# Patient Record
Sex: Male | Born: 1975
Health system: Southern US, Community
[De-identification: ages and names within clinical notes are randomized; demographics above are authoritative.]

## PROBLEM LIST (undated history)

## (undated) DIAGNOSIS — J302 Other seasonal allergic rhinitis: Secondary | ICD-10-CM

## (undated) DIAGNOSIS — E119 Type 2 diabetes mellitus without complications: Secondary | ICD-10-CM

## (undated) DIAGNOSIS — G8929 Other chronic pain: Secondary | ICD-10-CM

## (undated) DIAGNOSIS — J45909 Unspecified asthma, uncomplicated: Secondary | ICD-10-CM

## (undated) DIAGNOSIS — M549 Dorsalgia, unspecified: Secondary | ICD-10-CM

## (undated) HISTORY — PX: KNEE SURGERY: SHX244

## (undated) HISTORY — DX: Type 2 diabetes mellitus without complications: E11.9

## (undated) HISTORY — PX: KNEE ARTHROSCOPY: SUR90

---

## 1998-11-25 ENCOUNTER — Emergency Department (HOSPITAL_COMMUNITY): Admission: EM | Admit: 1998-11-25 | Discharge: 1998-11-25 | Payer: Self-pay | Admitting: Emergency Medicine

## 1999-08-05 ENCOUNTER — Emergency Department (HOSPITAL_COMMUNITY): Admission: EM | Admit: 1999-08-05 | Discharge: 1999-08-05 | Payer: Self-pay | Admitting: Emergency Medicine

## 1999-11-27 ENCOUNTER — Emergency Department (HOSPITAL_COMMUNITY): Admission: EM | Admit: 1999-11-27 | Discharge: 1999-11-27 | Payer: Self-pay | Admitting: Emergency Medicine

## 2000-05-20 ENCOUNTER — Emergency Department (HOSPITAL_COMMUNITY): Admission: EM | Admit: 2000-05-20 | Discharge: 2000-05-20 | Payer: Self-pay | Admitting: Emergency Medicine

## 2000-10-30 ENCOUNTER — Emergency Department (HOSPITAL_COMMUNITY): Admission: EM | Admit: 2000-10-30 | Discharge: 2000-10-30 | Payer: Self-pay | Admitting: Emergency Medicine

## 2001-01-02 ENCOUNTER — Encounter: Payer: Self-pay | Admitting: Emergency Medicine

## 2001-01-02 ENCOUNTER — Emergency Department (HOSPITAL_COMMUNITY): Admission: EM | Admit: 2001-01-02 | Discharge: 2001-01-02 | Payer: Self-pay | Admitting: Emergency Medicine

## 2003-01-05 ENCOUNTER — Emergency Department (HOSPITAL_COMMUNITY): Admission: EM | Admit: 2003-01-05 | Discharge: 2003-01-05 | Payer: Self-pay | Admitting: Emergency Medicine

## 2003-02-01 ENCOUNTER — Emergency Department (HOSPITAL_COMMUNITY): Admission: EM | Admit: 2003-02-01 | Discharge: 2003-02-01 | Payer: Self-pay | Admitting: Emergency Medicine

## 2003-02-13 ENCOUNTER — Emergency Department (HOSPITAL_COMMUNITY): Admission: EM | Admit: 2003-02-13 | Discharge: 2003-02-13 | Payer: Self-pay | Admitting: Emergency Medicine

## 2003-11-18 ENCOUNTER — Emergency Department (HOSPITAL_COMMUNITY): Admission: EM | Admit: 2003-11-18 | Discharge: 2003-11-18 | Payer: Self-pay | Admitting: Emergency Medicine

## 2004-04-12 ENCOUNTER — Emergency Department (HOSPITAL_COMMUNITY): Admission: EM | Admit: 2004-04-12 | Discharge: 2004-04-12 | Payer: Self-pay | Admitting: *Deleted

## 2004-08-05 ENCOUNTER — Emergency Department (HOSPITAL_COMMUNITY): Admission: EM | Admit: 2004-08-05 | Discharge: 2004-08-05 | Payer: Self-pay | Admitting: Family Medicine

## 2004-08-18 ENCOUNTER — Emergency Department (HOSPITAL_COMMUNITY): Admission: EM | Admit: 2004-08-18 | Discharge: 2004-08-18 | Payer: Self-pay | Admitting: Family Medicine

## 2004-12-14 ENCOUNTER — Emergency Department (HOSPITAL_COMMUNITY): Admission: EM | Admit: 2004-12-14 | Discharge: 2004-12-14 | Payer: Self-pay | Admitting: Family Medicine

## 2004-12-16 ENCOUNTER — Emergency Department (HOSPITAL_COMMUNITY): Admission: EM | Admit: 2004-12-16 | Discharge: 2004-12-16 | Payer: Self-pay | Admitting: Family Medicine

## 2005-10-30 ENCOUNTER — Emergency Department (HOSPITAL_COMMUNITY): Admission: EM | Admit: 2005-10-30 | Discharge: 2005-10-30 | Payer: Self-pay | Admitting: Emergency Medicine

## 2006-02-02 ENCOUNTER — Emergency Department (HOSPITAL_COMMUNITY): Admission: EM | Admit: 2006-02-02 | Discharge: 2006-02-02 | Payer: Self-pay | Admitting: Family Medicine

## 2008-10-07 ENCOUNTER — Emergency Department (HOSPITAL_COMMUNITY): Admission: EM | Admit: 2008-10-07 | Discharge: 2008-10-07 | Payer: Self-pay | Admitting: Emergency Medicine

## 2008-10-10 ENCOUNTER — Emergency Department (HOSPITAL_COMMUNITY): Admission: EM | Admit: 2008-10-10 | Discharge: 2008-10-10 | Payer: Self-pay | Admitting: Family Medicine

## 2010-05-13 ENCOUNTER — Encounter: Admission: RE | Admit: 2010-05-13 | Discharge: 2010-05-13 | Payer: Self-pay | Admitting: Internal Medicine

## 2013-05-06 ENCOUNTER — Emergency Department (INDEPENDENT_AMBULATORY_CARE_PROVIDER_SITE_OTHER)
Admission: EM | Admit: 2013-05-06 | Discharge: 2013-05-06 | Disposition: A | Discharge status: Home / Self Care | Payer: Self-pay | Source: Home / Self Care | Attending: Family Medicine | Admitting: Family Medicine

## 2013-05-06 ENCOUNTER — Encounter (HOSPITAL_COMMUNITY): Payer: Self-pay | Admitting: Emergency Medicine

## 2013-05-06 DIAGNOSIS — L03011 Cellulitis of right finger: Secondary | ICD-10-CM

## 2013-05-06 DIAGNOSIS — L03019 Cellulitis of unspecified finger: Secondary | ICD-10-CM

## 2013-05-06 MED ORDER — SULFAMETHOXAZOLE-TRIMETHOPRIM 800-160 MG PO TABS: 1.0000 | ORAL_TABLET | Freq: Two times a day (BID) | ORAL | Status: DC

## 2013-05-06 NOTE — ED Notes (Signed)
C/o pain and swelling in right pinkie finger  X 1 1/2 wks. Tried soaking in alcohol with mild relief. Denies any other symptoms.

## 2013-05-06 NOTE — ED Provider Notes (Signed)
Justin Drake is a 37 y.o. male who presents to Urgent Care today for right fifth finger paronychia present for about one and a half weeks. Patient has tried alcohol soaks which haven't worked well. He denies any radiating pain weakness numbness fevers or chills. 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 He denies any nausea vomiting or diarrhea.   History reviewed. No pertinent past medical history. History  Substance Use Topics  . Smoking status: Never Smoker   . Smokeless tobacco: Not on file  . Alcohol Use: No   ROS as above Medications reviewed. No current facility-administered medications for this encounter.   Current Outpatient Prescriptions  Medication Sig Dispense Refill  . sulfamethoxazole-trimethoprim (SEPTRA DS) 800-160 MG per tablet Take 1 tablet by mouth 2 (two) times daily.  14 tablet  0    Exam:  BP 121/86  Pulse 76  Temp(Src) 97.9 F (36.6 C) (Oral)  Resp 16  SpO2 99% Gen: Well NAD RIGHT FIFTH FINGER: Swollen fluctuant and tender just proximal to the nail fold. Tablet refill sensation motion is intact distally  Right fifth paronychia drainage: Consent obtained Area of fluctuance cleaned with alcohol cold spray applied and a small incision was made with 11 blade scalpel. Pus and blood was expressed. The pus was cultured. Patient tolerated the procedure well  Assessment and Plan: 37 y.o. male with paronychia status post incision and drainage and culture. Plan to treat empirically with Septra antibiotics.  Followup as needed Discussed warning signs or symptoms. Please see discharge instructions. Patient expresses understanding.      Rodolph Bong, MD 05/06/13 [phone]

## 2013-05-08 LAB — CULTURE, ROUTINE-ABSCESS

## 2013-05-09 ENCOUNTER — Telehealth (HOSPITAL_COMMUNITY): Payer: Self-pay | Admitting: *Deleted

## 2013-05-09 MED ORDER — CEPHALEXIN 500 MG PO CAPS: 500.0000 mg | ORAL_CAPSULE | Freq: Four times a day (QID) | ORAL | Status: DC

## 2013-05-09 NOTE — ED Notes (Signed)
RN to inform patient.   Rodolph Bong, MD 05/09/13 [phone]

## 2013-05-09 NOTE — ED Notes (Signed)
Discussed with Dr. Denyse Amass and he said to call for clinical improvement (like pain).  I called pt. Pt. verified x 2 and given results.  I asked how he was doing.  He said it is hurting really bad.  He has tried soaking it in alcohol and taking 3 Advil but still feels pressure and throbbing. No drainage since the day it was drained.  He uses [facility] on [address]. Dr. Denyse Amass notified and he e-prescribed Keflex 500 QID x 10 days.  Pt. notified and will pick up the Rx. Today.  Pt. instructed to come back if any worsening of symptoms. Vassie Moselle 05/09/2013

## 2013-05-09 NOTE — Telephone Encounter (Signed)
Message copied by Rodolph Bong on Thu May 09, 2013  4:37 PM ------      Message from: Vassie Moselle      Created: Wed May 08, 2013 11:10 PM      Regarding: lab       Abscess culture: few Viridans Streptococcus-no sensitivity.  Treated with Septra DS.  Is treatment adequate?      Vassie Moselle      05/08/2013       ------

## 2013-11-10 ENCOUNTER — Encounter (HOSPITAL_COMMUNITY): Payer: Self-pay | Admitting: Emergency Medicine

## 2013-11-10 ENCOUNTER — Emergency Department (HOSPITAL_COMMUNITY)
Admission: EM | Admit: 2013-11-10 | Discharge: 2013-11-10 | Disposition: A | Discharge status: Home / Self Care | Payer: Self-pay | Attending: Emergency Medicine | Admitting: Emergency Medicine

## 2013-11-10 DIAGNOSIS — B370 Candidal stomatitis: Secondary | ICD-10-CM | POA: Insufficient documentation

## 2013-11-10 DIAGNOSIS — K117 Disturbances of salivary secretion: Secondary | ICD-10-CM | POA: Insufficient documentation

## 2013-11-10 DIAGNOSIS — Z79899 Other long term (current) drug therapy: Secondary | ICD-10-CM | POA: Insufficient documentation

## 2013-11-10 DIAGNOSIS — R682 Dry mouth, unspecified: Secondary | ICD-10-CM

## 2013-11-10 MED ORDER — NYSTATIN 100000 UNIT/ML MT SUSP: 500000.0000 [IU] | Freq: Four times a day (QID) | OROMUCOSAL | Status: DC

## 2013-11-10 NOTE — ED Notes (Signed)
Pt states recently sick. Has sore throat, and states painful to swallow.

## 2013-11-10 NOTE — Discharge Instructions (Signed)
Sore or Dry Mouth Care A sore or dry mouth may happen for many different reasons. Sometimes, treatment for other health problems may have to stop until your sore or dry mouth gets better.  HOME CARE  Do not smoke or chew tobacco.  Use fake (artificial) saliva when your mouth feels dry.  Use a humidifier in your bedroom at night.  Eat small meals and snacks.  Eat food cold or at room temperature.  Suck on ice-chips or try frozen ice pops or juice bars, ice-cream, and watermelon. Do not have citrus flavors.  Suck on hard, sugarless, sour candy, or chew sugarless gum to help make more saliva.  Eat soft foods such as yogurt, bananas, canned fruit, mashed potatoes, oatmeal, rice, eggs, cottage cheese, macaroni and cheese, jello, and pudding.  Microwave vegetables and fruits to soften them.  Puree cooked food in a blender if needed.  Make dry food moist by using olive oil, gravy, or mild sauces. Dip foods in liquids.  Keep a glass of water or squirt bottle nearby. Take sips often throughout the day.  Limit caffeine.  Avoid:  Pop or fizzy drinks.  Alcohol.  Citrus juices.  Acidic food.  Salty or spicy food.  Foods or drinks that are very hot.  Hard or crunchy food. Mouth Care  Wash your hands well with soap and water before doing mouth care.  Use fake saliva as told by your doctor.  Use medicine on the sore places.  Brush your teeth at least 2 times a day. Brush after each meal if possible. Rinse your mouth with water after each meal and after drinking a sweet drink.  Brush slowly and gently in small circles. Do not brush side-to-side.  Use regular toothpastes, but stay away from ones that have sodium laurel sulfate in them.  Gargle with a baking soda mouthwash ( teaspoon baking soda mixed in with 4 cups of water).  Gargle with medicated mouthwash.  Use dental floss or dental tape to clean between your teeth every day.  Use a lanolin-based lip balm to keep  your lips from getting dry.  If you wear dentures or bridges:  You may need to leave them out until your doctor tells you to start wearing them again.  Take them out at night if you wear them daily. Soak them in warm water or denture solution. Take your dentures out as much as you can during the day. Take them out when you use mouthwash.  After each meal, brush your gums gently with a soft brush and rinse your mouth with water.  If your dentures rub on your gums and cause a sore spot, have your dentist check and fix your dentures right away. GET HELP RIGHT AWAY IF:   Your mouth gets more painful or dry.  You have questions. MAKE SURE YOU:  Understand these instructions.  Will watch your condition.  Will get help right away if you are not doing well or get worse. Document Released: 05/15/2009 Document Revised: 10/10/2011 Document Reviewed: 05/15/2009 [facility] Patient Information 2014 [place], [place]. Thrush, Adult  Ginette Pitman, also called oral candidiasis, is a fungal infection that develops in the mouth and throat and on the tongue. It causes white patches to form on the mouth and tongue. Ginette Pitman is most common in older adults, but it can occur at any age.  Many cases of thrush are mild, but this infection can also be more serious. Ginette Pitman can be a recurring problem for people who have chronic  illnesses or who take medicines that limit the body's ability to fight infection. Because these people have difficulty fighting infections, the fungus that causes thrush can spread throughout the body. This can cause life-threatening blood or organ infections. CAUSES  Ginette Pitman is usually caused by a yeast called Candida albicans. This fungus is normally present in small amounts in the mouth and on other mucous membranes. It usually causes no harm. However, when conditions are present that allow the fungus to grow uncontrolled, it invades surrounding tissues and becomes an infection. Less often, other  Candida species can also lead to thrush.  RISK FACTORS Ginette Pitman is more likely to develop in the following people:  People with an impaired ability to fight infection (weakened immune system).   Older adults.   People with HIV.   People with diabetes.   People with dry mouth (xerostomia).   Pregnant women.   People with poor dental care, especially those who have false teeth.   People who use antibiotic medicines.  SIGNS AND SYMPTOMS  Ginette Pitman can be a mild infection that causes no symptoms. If symptoms develop, they may include:   A burning feeling in the mouth and throat. This can occur at the start of a thrush infection.   White patches that adhere to the mouth and tongue. The tissue around the patches may be red, raw, and painful. If rubbed (during tooth brushing, for example), the patches and the tissue of the mouth may bleed easily.   A bad taste in the mouth or difficulty tasting foods.   Cottony feeling in the mouth.   Pain during eating and swallowing. DIAGNOSIS  Your health care provider can usually diagnose thrush by looking in your mouth and asking you questions about your health.  TREATMENT  Medicines that help prevent the growth of fungi (antifungals) are the standard treatment for thrush. These medicines are either applied directly to the affected area (topical) or swallowed (oral). The treatment will depend on the severity of the condition.  Mild Thrush Mild cases of thrush may clear up with the use of an antifungal mouth rinse or lozenges. Treatment usually lasts about 14 days.  Moderate to Severe Thrush  More severe thrush infections that have spread to the esophagus are treated with an oral antifungal medicine. A topical antifungal medicine may also be used.   For some severe infections, a treatment period longer than 14 days may be needed.   Oral antifungal medicines are almost never used during pregnancy because the fetus may be harmed.  However, if a pregnant woman has a rare, severe thrush infection that has spread to her blood, oral antifungal medicines may be used. In this case, the risk of harm to the mother and fetus from the severe thrush infection may be greater than the risk posed by the use of antifungal medicines.  Persistent or Recurrent Thrush For cases of thrush that do not go away or keep coming back, treatment may involve the following:   Treatment may be needed twice as long as the symptoms last.   Treatment will include both oral and topical antifungal medicines.   People with weakened immune systems can take an antifungal medicine on a continuous basis to prevent thrush infections.  It is important to treat conditions that make you more likely to get thrush, such as diabetes or HIV.  HOME CARE INSTRUCTIONS   Only take over-the-counter or prescription medicine as directed by your health care provider. Talk to your health care provider about  an over-the-counter medicine called gentian violet, which kills bacteria and fungi.   Eat plain, unflavored yogurt as directed by your health care provider. Check the label to make sure the yogurt contains live cultures. This yogurt can help healthy bacteria grow in the mouth that can stop the growth of the fungus that causes thrush.   Try these measures to help reduce the discomfort of thrush:   Drink cold liquids such as water or iced tea.   Try flavored ice treats or frozen juices.   Eat foods that are easy to swallow, such as gelatin, ice cream, or custard.   If the patches in your mouth are painful, try drinking from a straw.   Rinse your mouth several times a day with a warm saltwater rinse. You can make the saltwater mixture with 1 tsp (6 g) of salt in 8 fl oz (0.2 L) of warm water.   If you wear dentures, remove the dentures before going to bed, brush them vigorously, and soak them in a cleaning solution as directed by your health care provider.    Women who are breastfeeding should clean their nipples with an antifungal medicine as directed by their health care provider. Dry the nipples after breastfeeding. Applying lanolin-containing body lotion may help relieve nipple soreness.  SEEK MEDICAL CARE IF:  Your symptoms are getting worse or are not improving within 7 days of starting treatment.   You have symptoms of spreading infection, such as white patches on the skin outside of the mouth.   You are nursing and you have redness, burning, or pain in the nipples that is not relieved with treatment.  MAKE SURE YOU:  Understand these instructions.  Will watch your condition.  Will get help right away if you are not doing well or get worse. Document Released: 04/12/2004 Document Revised: 05/08/2013 Document Reviewed: 02/18/2013 [facility] Patient Information 2014 [place]

## 2013-11-10 NOTE — ED Provider Notes (Signed)
CSN: [id number]     Arrival date & time 11/10/13  2112 History  This chart was scribed for Justin Drake Justin Santellan, NP working with Glynn OctaveStephen Rancour, MD by Quintella ReichertMatthew Underwood, ED Scribe. This patient was seen in room TR09C/TR09C and the patient's care was started at 10:11 PM.   Chief Complaint  Patient presents with  . Sore Throat    Patient is a 38 y.o. male presenting with pharyngitis. The history is provided by the patient. No language interpreter was used.  Sore Throat This is a new problem. The current episode started more than 2 days ago. The problem occurs constantly. The problem has been gradually worsening. Exacerbated by: swallowing. Treatments tried: biotin. The treatment provided no relief.    HPI Comments: Justin Drake is a 38 y.o. male who presents to the Emergency Department complaining of "cotton mouth" that began 5 days ago.  Pt states he has seasonal allergies and has had recent upper respiratory symptoms due to pollen, including productive cough and congestion.  He used antihistamines but states his mouth has become extremely dry since then.  He states his mouth feels uncomfortable but denies pain or itching in his throat.  Discomfort is worsened by swallowing and he states he has not been eating solids due to this.  He has attempted to treat symptoms with biotin, without relief.  Pt denies recent antibiotic use.  He denies h/o DM.  He denies any other recent illness.  Pt has no PCP   History reviewed. No pertinent past medical history.   Past Surgical History  Procedure Laterality Date  . Knee surgery      History reviewed. No pertinent family history.   History  Substance Use Topics  . Smoking status: Never Smoker   . Smokeless tobacco: Never Used  . Alcohol Use: No     Review of Systems  Constitutional: Negative for fever.  HENT: Positive for congestion. Negative for sore throat.        "cotton mouth"  Respiratory: Positive for cough.   All other systems  reviewed and are negative.     Allergies  Review of patient's allergies indicates no known allergies.  Home Medications   Current Outpatient Rx  Name  Route  Sig  Dispense  Refill  . fexofenadine-pseudoephedrine (ALLEGRA-D 24) 180-240 MG per 24 hr tablet   Oral   Take 1 tablet by mouth daily.          BP 146/96  Pulse 93  Temp(Src) 98.5 F (36.9 C) (Oral)  Resp 20  Ht 5\' 9"  (1.753 m)  Wt 230 lb 12.8 oz (104.69 kg)  BMI 34.07 kg/m2  SpO2 98%  Physical Exam  Nursing note and vitals reviewed. Constitutional: He is oriented to person, place, and time. He appears well-developed and well-nourished. No distress.  HENT:  Head: Normocephalic and atraumatic.  White plaques on the soft palate and uvula  Eyes: EOM are normal.  Neck: Neck supple. No tracheal deviation present.  Cardiovascular: Normal rate.   Pulmonary/Chest: Effort normal. No respiratory distress.  Musculoskeletal: Normal range of motion.  Neurological: He is alert and oriented to person, place, and time.  Skin: Skin is warm and dry.  Psychiatric: He has a normal mood and affect. His behavior is normal.    ED Course  Procedures (including critical care time)  DIAGNOSTIC STUDIES: Oxygen Saturation is 98% on room air, normal by my interpretation.    COORDINATION OF CARE: 10:16 PM-Discussed treatment plan which includes Nystatin with pt  at bedside and pt agreed to plan.     Labs Review Labs Reviewed - No data to display  Imaging Review No results found.   EKG Interpretation None      MDM   Final diagnoses:  None    Oral thrush in an non-immunocompromised patient who reports recent dry mouth symptoms..      I personally performed the services described in this documentation, which was scribed in my presence. The recorded information has been reviewed and is accurate.    Justin Norman, NP 11/11/13 [phone]

## 2013-11-11 NOTE — ED Provider Notes (Signed)
Medical screening examination/treatment/procedure(s) were performed by non-physician practitioner and as supervising physician I was immediately available for consultation/collaboration.   EKG Interpretation None       Glynn OctaveStephen Haydon Dorris, MD 11/11/13 1100

## 2013-11-17 ENCOUNTER — Observation Stay (HOSPITAL_COMMUNITY)
Admission: EM | Admit: 2013-11-17 | Discharge: 2013-11-18 | Disposition: A | Discharge status: Home / Self Care | Payer: Self-pay | Attending: Internal Medicine | Admitting: Internal Medicine

## 2013-11-17 ENCOUNTER — Encounter (HOSPITAL_COMMUNITY): Payer: Self-pay | Admitting: Emergency Medicine

## 2013-11-17 DIAGNOSIS — E1110 Type 2 diabetes mellitus with ketoacidosis without coma: Secondary | ICD-10-CM | POA: Diagnosis present

## 2013-11-17 DIAGNOSIS — R358 Other polyuria: Secondary | ICD-10-CM

## 2013-11-17 DIAGNOSIS — E119 Type 2 diabetes mellitus without complications: Secondary | ICD-10-CM | POA: Clinically undetermined

## 2013-11-17 DIAGNOSIS — M549 Dorsalgia, unspecified: Secondary | ICD-10-CM | POA: Insufficient documentation

## 2013-11-17 DIAGNOSIS — R5383 Other fatigue: Secondary | ICD-10-CM

## 2013-11-17 DIAGNOSIS — G8929 Other chronic pain: Secondary | ICD-10-CM | POA: Insufficient documentation

## 2013-11-17 DIAGNOSIS — R5381 Other malaise: Secondary | ICD-10-CM

## 2013-11-17 DIAGNOSIS — R3589 Other polyuria: Secondary | ICD-10-CM

## 2013-11-17 DIAGNOSIS — J301 Allergic rhinitis due to pollen: Secondary | ICD-10-CM | POA: Insufficient documentation

## 2013-11-17 DIAGNOSIS — Z23 Encounter for immunization: Secondary | ICD-10-CM | POA: Insufficient documentation

## 2013-11-17 DIAGNOSIS — R631 Polydipsia: Secondary | ICD-10-CM

## 2013-11-17 DIAGNOSIS — R634 Abnormal weight loss: Secondary | ICD-10-CM

## 2013-11-17 DIAGNOSIS — E1310 Other specified diabetes mellitus with ketoacidosis without coma: Principal | ICD-10-CM | POA: Insufficient documentation

## 2013-11-17 DIAGNOSIS — H538 Other visual disturbances: Secondary | ICD-10-CM

## 2013-11-17 HISTORY — DX: Dorsalgia, unspecified: M54.9

## 2013-11-17 HISTORY — DX: Other seasonal allergic rhinitis: J30.2

## 2013-11-17 HISTORY — DX: Unspecified asthma, uncomplicated: J45.909

## 2013-11-17 HISTORY — DX: Other chronic pain: G89.29

## 2013-11-17 LAB — COMPREHENSIVE METABOLIC PANEL
ALT: 34 U/L (ref 0–53)
AST: 33 U/L (ref 0–37)
Albumin: 4.2 g/dL (ref 3.5–5.2)
Alkaline Phosphatase: 112 U/L (ref 39–117)
BUN: 14 mg/dL (ref 6–23)
CALCIUM: 9.7 mg/dL (ref 8.4–10.5)
CHLORIDE: 94 meq/L — AB (ref 96–112)
CO2: 19 mEq/L (ref 19–32)
CREATININE: 0.86 mg/dL (ref 0.50–1.35)
Glucose, Bld: 426 mg/dL — ABNORMAL HIGH (ref 70–99)
Potassium: 4.6 mEq/L (ref 3.7–5.3)
SODIUM: 134 meq/L — AB (ref 137–147)
Total Bilirubin: 0.6 mg/dL (ref 0.3–1.2)
Total Protein: 7.9 g/dL (ref 6.0–8.3)

## 2013-11-17 LAB — BASIC METABOLIC PANEL
BUN: 11 mg/dL (ref 6–23)
BUN: 10 mg/dL (ref 6–23)
CO2: 20 mEq/L (ref 19–32)
CO2: 21 mEq/L (ref 19–32)
CREATININE: 0.77 mg/dL (ref 0.50–1.35)
Calcium: 8.8 mg/dL (ref 8.4–10.5)
Calcium: 9 mg/dL (ref 8.4–10.5)
Chloride: 102 mEq/L (ref 96–112)
Chloride: 103 mEq/L (ref 96–112)
Creatinine, Ser: 0.83 mg/dL (ref 0.50–1.35)
GFR calc Af Amer: >90 mL/min (ref >90)
GFR calc non Af Amer: >90 mL/min (ref >90)
Glucose, Bld: 271 mg/dL — ABNORMAL HIGH (ref 70–99)
Glucose, Bld: 213 mg/dL — ABNORMAL HIGH (ref 70–99)
POTASSIUM: 4.2 meq/L (ref 3.7–5.3)
Potassium: 3.6 mEq/L — ABNORMAL LOW (ref 3.7–5.3)
Sodium: 139 mEq/L (ref 137–147)
Sodium: 139 mEq/L (ref 137–147)

## 2013-11-17 LAB — URINALYSIS, ROUTINE W REFLEX MICROSCOPIC
BILIRUBIN URINE: NEGATIVE
Glucose, UA: >1000 mg/dL — AB
Hgb urine dipstick: NEGATIVE
Ketones, ur: >80 mg/dL — AB
Leukocytes, UA: NEGATIVE
NITRITE: NEGATIVE
Protein, ur: NEGATIVE mg/dL
SPECIFIC GRAVITY, URINE: 1.039 — AB (ref 1.005–1.030)
UROBILINOGEN UA: 0.2 mg/dL (ref 0.0–1.0)
pH: 5 (ref 5.0–8.0)

## 2013-11-17 LAB — CBC
HEMATOCRIT: 41.3 % (ref 39.0–52.0)
Hemoglobin: 15.2 g/dL (ref 13.0–17.0)
MCH: 32.8 pg (ref 26.0–34.0)
MCHC: 36.8 g/dL — AB (ref 30.0–36.0)
MCV: 89 fL (ref 78.0–100.0)
PLATELETS: 136 10*3/uL — AB (ref 150–400)
RBC: 4.64 MIL/uL (ref 4.22–5.81)
RDW: 11.8 % (ref 11.5–15.5)
WBC: 4.1 10*3/uL (ref 4.0–10.5)

## 2013-11-17 LAB — URINE MICROSCOPIC-ADD ON

## 2013-11-17 LAB — CBG MONITORING, ED
GLUCOSE-CAPILLARY: 268 mg/dL — AB (ref 70–99)
Glucose-Capillary: 358 mg/dL — ABNORMAL HIGH (ref 70–99)
Glucose-Capillary: 264 mg/dL — ABNORMAL HIGH (ref 70–99)

## 2013-11-17 LAB — GLUCOSE, CAPILLARY
GLUCOSE-CAPILLARY: 165 mg/dL — AB (ref 70–99)
Glucose-Capillary: 193 mg/dL — ABNORMAL HIGH (ref 70–99)

## 2013-11-17 LAB — HIV ANTIBODY (ROUTINE TESTING W REFLEX): HIV 1&2 Ab, 4th Generation: NONREACTIVE

## 2013-11-17 LAB — HEMOGLOBIN A1C
HEMOGLOBIN A1C: 13.2 % — AB (ref <5.7)
MEAN PLASMA GLUCOSE: 332 mg/dL — AB (ref <117)

## 2013-11-17 MED ORDER — ENOXAPARIN SODIUM 40 MG/0.4ML ~~LOC~~ SOLN
40.0000 mg | SUBCUTANEOUS | Status: DC
  Administered 2013-11-17: 40 mg via SUBCUTANEOUS
  Filled 2013-11-17 (×2): qty 0.4

## 2013-11-17 MED ORDER — INSULIN GLARGINE 100 UNIT/ML ~~LOC~~ SOLN
5.0000 [IU] | Freq: Every day | SUBCUTANEOUS | Status: DC
  Administered 2013-11-17: 22:00:00 5 [IU] via SUBCUTANEOUS
  Filled 2013-11-17 (×3): qty 0.05

## 2013-11-17 MED ORDER — ONDANSETRON HCL 4 MG PO TABS: 4.0000 mg | ORAL_TABLET | Freq: Four times a day (QID) | ORAL | Status: DC | PRN

## 2013-11-17 MED ORDER — INSULIN ASPART 100 UNIT/ML ~~LOC~~ SOLN
0.0000 [IU] | SUBCUTANEOUS | Status: DC
  Administered 2013-11-17: 21:00:00 2 [IU] via SUBCUTANEOUS
  Administered 2013-11-17: 5 [IU] via SUBCUTANEOUS
  Administered 2013-11-18: 01:00:00 3 [IU] via SUBCUTANEOUS
  Administered 2013-11-18: 09:00:00 2 [IU] via SUBCUTANEOUS
  Administered 2013-11-18: 04:00:00 3 [IU] via SUBCUTANEOUS
  Filled 2013-11-17: qty 1

## 2013-11-17 MED ORDER — PNEUMOCOCCAL VAC POLYVALENT 25 MCG/0.5ML IJ INJ
0.5000 mL | INJECTION | INTRAMUSCULAR | Status: AC
  Administered 2013-11-18: 0.5 mL via INTRAMUSCULAR
  Filled 2013-11-17: qty 0.5

## 2013-11-17 MED ORDER — SODIUM CHLORIDE 0.45 % IV SOLN
INTRAVENOUS | Status: DC
  Administered 2013-11-17 – 2013-11-18 (×2): via INTRAVENOUS
  Administered 2013-11-18: 1000 mL via INTRAVENOUS

## 2013-11-17 MED ORDER — POTASSIUM CHLORIDE CRYS ER 20 MEQ PO TBCR
40.0000 meq | EXTENDED_RELEASE_TABLET | Freq: Once | ORAL | Status: DC
  Filled 2013-11-17: qty 2

## 2013-11-17 MED ORDER — ONDANSETRON HCL 4 MG/2ML IJ SOLN: 4.0000 mg | Freq: Four times a day (QID) | INTRAMUSCULAR | Status: DC | PRN

## 2013-11-17 MED ORDER — IBUPROFEN 600 MG PO TABS
600.0000 mg | ORAL_TABLET | Freq: Four times a day (QID) | ORAL | Status: DC | PRN
  Administered 2013-11-17: 600 mg via ORAL
  Filled 2013-11-17 (×2): qty 1

## 2013-11-17 MED ORDER — SODIUM CHLORIDE 0.9 % IV BOLUS (SEPSIS)
1000.0000 mL | Freq: Once | INTRAVENOUS | Status: AC
  Administered 2013-11-17: 1000 mL via INTRAVENOUS

## 2013-11-17 MED ORDER — INSULIN ASPART 100 UNIT/ML ~~LOC~~ SOLN
8.0000 [IU] | Freq: Once | SUBCUTANEOUS | Status: AC
  Administered 2013-11-17: 8 [IU] via INTRAVENOUS
  Filled 2013-11-17: qty 1

## 2013-11-17 NOTE — ED Provider Notes (Signed)
CSN: [id number]     Arrival date & time 11/17/13  1148 History   First MD Initiated Contact with Patient 11/17/13 1206     Chief Complaint  Patient presents with  . Weight Loss  . Weakness     (Consider location/radiation/quality/duration/timing/severity/associated sxs/prior Treatment) Patient is a 38 y.o. male presenting with weakness. The history is provided by the patient.  Weakness Pertinent negatives include no chest pain, no abdominal pain, no headaches and no shortness of breath.   patient has been generally weak and feels as if his been urinating freely. He has been thirsty all the time. He states he is worried about diabetes. He states he's in ER several days ago and diagnosed with thrush. He states he has a family history of diabetes.  Past Medical History  Diagnosis Date  . Asthma     when young  . Seasonal allergies   . Seasonal allergies   . Back pain, chronic    Past Surgical History  Procedure Laterality Date  . Knee surgery  11/1990 05/1992    L and R knee  . Knee arthroscopy  april 1993 left, Oct 1994 right   Family History  Problem Relation Age of Onset  . Diabetes Mother   . Renal Disease Mother   . Hypertension Mother   . Stroke      great grand mother  . Cancer      great uncle  . Migraines Father    History  Substance Use Topics  . Smoking status: Never Smoker   . Smokeless tobacco: Never Used  . Alcohol Use: No    Review of Systems  Constitutional: Negative for activity change and appetite change.  Eyes: Negative for pain.  Respiratory: Negative for chest tightness and shortness of breath.   Cardiovascular: Negative for chest pain and leg swelling.  Gastrointestinal: Negative for nausea, vomiting, abdominal pain and diarrhea.  Endocrine: Positive for polydipsia, polyphagia and polyuria.  Genitourinary: Positive for urgency and frequency. Negative for flank pain.  Musculoskeletal: Negative for back pain and neck stiffness.  Skin: Negative  for rash.  Neurological: Positive for weakness. Negative for numbness and headaches.  Psychiatric/Behavioral: Negative for behavioral problems.      Allergies  Review of patient's allergies indicates no known allergies.  Home Medications   Prior to Admission medications   Medication Sig Start Date End Date Taking? Authorizing Provider  fexofenadine-pseudoephedrine (ALLEGRA-D 24) 180-240 MG per 24 hr tablet Take 1 tablet by mouth daily.    Historical Provider, MD  nystatin (MYCOSTATIN) 100000 UNIT/ML suspension Take 5 mLs (500,000 Units total) by mouth 4 (four) times daily. 11/10/13   Jimmye Normanavid John Smith, NP   BP 132/89  Pulse 71  Temp(Src) 98.3 F (36.8 C) (Oral)  Resp 16  Ht 5\' 9"  (1.753 m)  Wt 230 lb 4.8 oz (104.463 kg)  BMI 33.99 kg/m2  SpO2 98% Physical Exam  Nursing note and vitals reviewed. Constitutional: He is oriented to person, place, and time. He appears well-developed and well-nourished.  HENT:  Head: Normocephalic and atraumatic.  Eyes: EOM are normal. Pupils are equal, round, and reactive to light.  Neck: Normal range of motion. Neck supple.  Cardiovascular: Normal rate, regular rhythm and normal heart sounds.   No murmur heard. Pulmonary/Chest: Effort normal and breath sounds normal.  Abdominal: Soft. Bowel sounds are normal. He exhibits no distension and no mass. There is no tenderness. There is no rebound and no guarding.  Musculoskeletal: Normal range of motion. He  exhibits no edema.  Neurological: He is alert and oriented to person, place, and time. No cranial nerve deficit.  Skin: Skin is warm and dry.  Psychiatric: He has a normal mood and affect.    ED Course  Procedures (including critical care time) Labs Review Labs Reviewed  CBC - Abnormal; Notable for the following:    MCHC 36.8 (*)    Platelets 136 (*)    All other components within normal limits  COMPREHENSIVE METABOLIC PANEL - Abnormal; Notable for the following:    Sodium 134 (*)     Chloride 94 (*)    Glucose, Bld 426 (*)    All other components within normal limits  URINALYSIS, ROUTINE W REFLEX MICROSCOPIC - Abnormal; Notable for the following:    Specific Gravity, Urine 1.039 (*)    Glucose, UA >1000 (*)    Ketones, ur >80 (*)    All other components within normal limits  HEMOGLOBIN A1C - Abnormal; Notable for the following:    Hemoglobin A1C 13.2 (*)    Mean Plasma Glucose 332 (*)    All other components within normal limits  BASIC METABOLIC PANEL - Abnormal; Notable for the following:    Glucose, Bld 271 (*)    All other components within normal limits  BASIC METABOLIC PANEL - Abnormal; Notable for the following:    Potassium 3.6 (*)    Glucose, Bld 213 (*)    All other components within normal limits  GLUCOSE, CAPILLARY - Abnormal; Notable for the following:    Glucose-Capillary 165 (*)    All other components within normal limits  CBG MONITORING, ED - Abnormal; Notable for the following:    Glucose-Capillary 358 (*)    All other components within normal limits  CBG MONITORING, ED - Abnormal; Notable for the following:    Glucose-Capillary 264 (*)    All other components within normal limits  CBG MONITORING, ED - Abnormal; Notable for the following:    Glucose-Capillary 268 (*)    All other components within normal limits  URINE MICROSCOPIC-ADD ON  HIV ANTIBODY (ROUTINE TESTING)  BASIC METABOLIC PANEL  BASIC METABOLIC PANEL  BASIC METABOLIC PANEL    Imaging Review No results found.   EKG Interpretation None      MDM   Final diagnoses:  DKA (diabetic ketoacidosis)    Patient with new-onset diabetes with mild DKA. Patient is well-appearing but does not have followup. Will admit to internal medicine.    Juliet RudeNathan R. Rubin PayorPickering, MD 11/17/13 2036

## 2013-11-17 NOTE — Progress Notes (Signed)
NURSING PROGRESS NOTE  Justin Drake [id number] Admission Data: 11/17/2013 6:41 PM Attending Provider: Jonah BlueAlejandro Paya, DO PCP:No PCP Per Patient Code Status: Full  Justin Drake is a 38 y.o. male patient admitted from ED:  -No acute distress noted.  -No complaints of shortness of breath.  -No complaints of chest pain.    Blood pressure 132/89, pulse 71, temperature 98.3 F (36.8 C), temperature source Oral, resp. rate 16, height 5\' 9"  (1.753 m), weight 104.463 kg (230 lb 4.8 oz), SpO2 98.00%.   IV Fluids:  IV in place, occlusive dsg intact without redness, IV cath antecubital right, condition patent and no redness 0.49% normal saline.   Allergies:  Review of patient's allergies indicates no known allergies.  Past Medical History:   has a past medical history of Asthma; Seasonal allergies; Seasonal allergies; and Back pain, chronic.  Past Surgical History:   has past surgical history that includes Knee surgery (11/1990 05/1992) and Knee arthroscopy (april 1993 left, Oct 1994 right).  Social History:   reports that he has never smoked. He has never used smokeless tobacco. He reports that he does not drink alcohol or use illicit drugs.  Skin: intact  Patient/Family orientated to room. Information packet given to patient/family. Admission inpatient armband information verified with patient/family to include name and date of birth and placed on patient arm. Side rails up x 2, fall assessment and education completed with patient/family. Patient/family able to verbalize understanding of risk associated with falls and verbalized understanding to call for assistance before getting out of bed. Call light within reach. Patient/family able to voice and demonstrate understanding of unit orientation instructions.    Will continue to evaluate and treat per MD orders.

## 2013-11-17 NOTE — ED Notes (Signed)
Pt wants to be checked for diabetes. Pt reports that he was been feeling weak for 1 1/2 weeks. Pt also reports that he has lost weight since he last was here. CBG 385 in triage. Pt has family history of diabetes.

## 2013-11-17 NOTE — H&P (Signed)
Date: 11/17/2013               Patient Name:  Justin Drake MRN: [id number]  DOB: May 31, 1976 Age / Sex: 38 y.o., male   PCP: No Pcp Per Patient         Medical Service: Internal Medicine Teaching Service         Attending Physician: Dr. Juliet RudeNathan R. Rubin PayorPickering, MD    First Contact: Dr. Yetta BarreJones Pager: [phone]  Second Contact: Dr. Virgina OrganQureshi Pager: [phone]       After Hours (After 5p/  First Contact Pager: [phone]  weekends / holidays): Second Contact Pager: [phone]   Chief Complaint: Weakness, polyuria, polydipsia, weight loss  History of Present Illness: Justin Drake is a 38 y.o. male w/ PMHx of Asthma, presents to the ED w/ complaints of weakness. The patient claims he has felt generally unwell for the past 2-3 weeks, starting w/ a cold, consisting of URI symptoms, which then progressed into oral thrush that [facility] for w/ Nystatin. During this same period of time, the patient admits to polyuria, significant polydipsia, fatigue, decreased appetite, nausea, and weight loss. The patient claims he has lost ~15 lbs in the past 3-4 weeks. He denies any h/o DM or other past medical history, and does not take any medications. He also denies any h/o alcohol use, drug abuse or h/o smoking. Patient also denies recent h/o fever, chills, abdominal pain, vomiting, diarrhea, constipation, chest pain, SOB, dizziness, lightheadedness, or palpitations.  On arrival to the ED, patient noted to have CBG's in the 400's, and AG of 21, HCO3 of 19. UA also showed glucose >1000 and ketones >80.   Meds: Current Facility-Administered Medications  Medication Dose Route Frequency Provider Last Rate Last Dose  . sodium chloride 0.9 % bolus 1,000 mL  1,000 mL Intravenous Once Harrold DonathNathan R. Pickering, MD 1,000 mL/hr at 11/17/13 1416 1,000 mL at 11/17/13 1416   No current outpatient prescriptions on file.    Allergies: Allergies as of 11/17/2013  . (No Known Allergies)    Past Medical History  Diagnosis Date  . Asthma    Past Surgical History  Procedure Laterality Date  . Knee surgery     Family History  Problem Relation Age of Onset  . Diabetes Mother    History   Social History  . Marital Status: Married    Spouse Name: N/A    Number of Children: N/A  . Years of Education: N/A   Occupational History  . Not on file.   Social History Main Topics  . Smoking status: Never Smoker   . Smokeless tobacco: Never Used  . Alcohol Use: No  . Drug Use: No  . Sexual Activity: Yes   Other Topics Concern  . Not on file   Social History Narrative  . No narrative on file    Review of Systems: General: Positive for decreased appetite and fatigue. Denies fever, chills, and diaphoresis.  Respiratory: Denies SOB, DOE, cough, chest tightness, and wheezing.   Cardiovascular: Denies chest pain and palpitations.  Gastrointestinal: Positive for mild nausea. Denies vomiting, abdominal pain, diarrhea, constipation, blood in stool and abdominal distention.  Genitourinary: Denies dysuria, urgency, frequency, hematuria, and flank pain. Endocrine: Positive for polyuria and polydipsia. Denies hot or cold intolerance.. Musculoskeletal: Denies myalgias, back pain, joint swelling, arthralgias and gait problem.  Skin: Denies pallor, rash and wounds.  Neurological: Positive for weakness. Denies dizziness, seizures, syncope, lightheadedness, numbness and  headaches.  Psychiatric/Behavioral: Denies mood changes, confusion, nervousness, sleep disturbance and agitation.  Physical Exam: Filed Vitals:   11/17/13 1215 11/17/13 1231 11/17/13 1330 11/17/13 1400  BP: 132/82 139/93 134/81 131/85  Pulse: 80 82 73 77  Temp:      Resp: 18 18  18   Height:      Weight:      SpO2: 98% 98% 98% 98%  General: Vital signs reviewed.  Patient is a well-developed and well-nourished, in no acute distress and cooperative with exam.  Head: Normocephalic and atraumatic. Eyes: PERRL,  EOMI, conjunctivae normal, No scleral icterus.  Neck: Supple, trachea midline, normal ROM, No JVD, masses, thyromegaly, or carotid bruit present.  Cardiovascular: RRR, S1 normal, S2 normal, no murmurs, gallops, or rubs. Pulmonary/Chest: Air entry equal bilaterally, no wheezes, rales, or rhonchi. Abdominal: Soft, non-tender, non-distended, BS +, no masses, organomegaly, or guarding present.  Musculoskeletal: No joint deformities, erythema, or stiffness, ROM full and nontender. Extremities: No swelling or edema,  pulses symmetric and intact bilaterally. No cyanosis or clubbing. Neurological: A&O x3, Strength is normal and symmetric bilaterally, cranial nerve II-XII are grossly intact, no focal motor deficit, sensory intact to light touch bilaterally.  Skin: Warm, dry and intact. No rashes or erythema. Psychiatric: Normal mood and affect. Speech and behavior is normal. Cognition and memory are normal.   Lab results: Basic Metabolic Panel:  Recent Labs  16/10/96 1215  NA 134*  K 4.6  CL 94*  CO2 19  GLUCOSE 426*  BUN 14  CREATININE 0.86  CALCIUM 9.7   Liver Function Tests:  Recent Labs  11/17/13 1215  AST 33  ALT 34  ALKPHOS 112  BILITOT 0.6  PROT 7.9  ALBUMIN 4.2   CBC:  Recent Labs  11/17/13 1215  WBC 4.1  HGB 15.2  HCT 41.3  MCV 89.0  PLT 136*   CBG:  Recent Labs  11/17/13 1345 11/17/13 1504  GLUCAP 358* 264*   Hemoglobin A1C: No results found for this basename: HGBA1C,  in the last 72 hours  Urinalysis:  Recent Labs  11/17/13 1343  COLORURINE YELLOW  LABSPEC 1.039*  PHURINE 5.0  GLUCOSEU >1000*  HGBUR NEGATIVE  BILIRUBINUR NEGATIVE  KETONESUR >80*  PROTEINUR NEGATIVE  UROBILINOGEN 0.2  NITRITE NEGATIVE  LEUKOCYTESUR NEGATIVE   Other results: EKG: none  Assessment & Plan by Problem: [name] is a 38 y.o. male w/ PMHx of Asthma and ?DM type II, admitted for suspected DKA.  Suspected Diabetic Ketoacidosis- Patient not  previously diagnosed w/ DM, however, he complains of weakness, fatigue, polyuria, polydipsia, blurry vision and recent weight loss for the past 3 weeks. Presents to the ED w/ CBG of 426, AG of 21, and HCO3 of 19, UA shows >1000 glucose and >80 ketones. Was also seen in the ED on 11/10/13 for sore throat, shown to have oral thrush. Patient describes recent h/o URI prior to onset of previously described symptoms. Suspect that patient is a newly diagnosed DM type II, however, given the patient's young age and ketosis, some question of mild autoimmune etiology. Given Novolog 8 units in ED, decreased blood sugar from 426 to 358. Also received 2L NS.  -NPO for now -Repeat BMP -HbA1c pending -IVF's; 1/2NS @ 125 ml/hr -Lantus 5 units for now -ISS-S w/ q4h CBG's; change to AC/HS when not NPO -Zofran prn nausea. -HIV pending -I/O's  DVT/PE PPx- Lovenox [place]  Dispo: Disposition is deferred at this time, awaiting improvement of current medical problems. Anticipated  discharge in approximately 1-2 day(s).   The patient does not have a current PCP (No Pcp Per Patient) and [facility] not [facility] after [facility].  The patient does not have transportation limitations that hinder transportation to clinic appointments.  Signed: Courtney ParisEden W Lavergne Hiltunen, MD 11/17/2013, 3:09 PM

## 2013-11-18 DIAGNOSIS — E119 Type 2 diabetes mellitus without complications: Secondary | ICD-10-CM

## 2013-11-18 DIAGNOSIS — E1310 Other specified diabetes mellitus with ketoacidosis without coma: Secondary | ICD-10-CM

## 2013-11-18 HISTORY — DX: Type 2 diabetes mellitus without complications: E11.9

## 2013-11-18 LAB — BASIC METABOLIC PANEL
BUN: 8 mg/dL (ref 6–23)
BUN: 9 mg/dL (ref 6–23)
BUN: 9 mg/dL (ref 6–23)
CALCIUM: 8.5 mg/dL (ref 8.4–10.5)
CALCIUM: 8.7 mg/dL (ref 8.4–10.5)
CO2: 18 mEq/L — ABNORMAL LOW (ref 19–32)
CO2: 20 meq/L (ref 19–32)
CO2: 21 mEq/L (ref 19–32)
CREATININE: 0.75 mg/dL (ref 0.50–1.35)
CREATININE: 0.77 mg/dL (ref 0.50–1.35)
Calcium: 8.5 mg/dL (ref 8.4–10.5)
Chloride: 102 mEq/L (ref 96–112)
Chloride: 103 mEq/L (ref 96–112)
Chloride: 99 mEq/L (ref 96–112)
Creatinine, Ser: 0.74 mg/dL (ref 0.50–1.35)
GFR calc Af Amer: >90 mL/min (ref >90)
GFR calc Af Amer: >90 mL/min (ref >90)
GFR calc non Af Amer: >90 mL/min (ref >90)
GFR calc non Af Amer: >90 mL/min (ref >90)
GLUCOSE: 151 mg/dL — AB (ref 70–99)
GLUCOSE: 211 mg/dL — AB (ref 70–99)
Glucose, Bld: 178 mg/dL — ABNORMAL HIGH (ref 70–99)
Potassium: 3.4 mEq/L — ABNORMAL LOW (ref 3.7–5.3)
Potassium: 3.6 mEq/L — ABNORMAL LOW (ref 3.7–5.3)
Potassium: 3.6 mEq/L — ABNORMAL LOW (ref 3.7–5.3)
SODIUM: 139 meq/L (ref 137–147)
Sodium: 135 mEq/L — ABNORMAL LOW (ref 137–147)
Sodium: 137 mEq/L (ref 137–147)

## 2013-11-18 LAB — GLUCOSE, CAPILLARY
GLUCOSE-CAPILLARY: 206 mg/dL — AB (ref 70–99)
Glucose-Capillary: 150 mg/dL — ABNORMAL HIGH (ref 70–99)
Glucose-Capillary: 150 mg/dL — ABNORMAL HIGH (ref 70–99)
Glucose-Capillary: 210 mg/dL — ABNORMAL HIGH (ref 70–99)

## 2013-11-18 LAB — MAGNESIUM: Magnesium: 1.9 mg/dL (ref 1.5–2.5)

## 2013-11-18 MED ORDER — INSULIN NPH (HUMAN) (ISOPHANE) 100 UNIT/ML ~~LOC~~ SUSP
5.0000 [IU] | Freq: Two times a day (BID) | SUBCUTANEOUS | Status: DC
  Administered 2013-11-18: 5 [IU] via SUBCUTANEOUS
  Filled 2013-11-18: qty 10

## 2013-11-18 MED ORDER — BD GETTING STARTED TAKE HOME KIT: 3/10ML X 30G SYRINGES
1.0000 | Freq: Once | Status: AC
  Administered 2013-11-18: 1
  Filled 2013-11-18: qty 1

## 2013-11-18 MED ORDER — BD GETTING STARTED TAKE HOME KIT: 1/2ML X 30G SYRINGES: 1.0000 | Freq: Once | Status: DC

## 2013-11-18 MED ORDER — ACETAMINOPHEN 325 MG PO TABS
325.0000 mg | ORAL_TABLET | Freq: Once | ORAL | Status: AC
  Administered 2013-11-18: 325 mg via ORAL
  Filled 2013-11-18: qty 1

## 2013-11-18 MED ORDER — POTASSIUM CHLORIDE 10 MEQ/100ML IV SOLN
10.0000 meq | INTRAVENOUS | Status: DC
  Administered 2013-11-18 (×2): 10 meq via INTRAVENOUS
  Filled 2013-11-18 (×4): qty 100

## 2013-11-18 MED ORDER — BLOOD GLUC METER DISP-STRIPS DEVI: Status: DC

## 2013-11-18 MED ORDER — METFORMIN HCL ER 500 MG PO TB24
500.0000 mg | ORAL_TABLET | Freq: Every day | ORAL | Status: DC
  Administered 2013-11-18: 500 mg via ORAL
  Filled 2013-11-18 (×2): qty 1

## 2013-11-18 MED ORDER — LANCETS MISC: Status: DC

## 2013-11-18 MED ORDER — LIVING WELL WITH DIABETES BOOK
Freq: Once | Status: AC
  Administered 2013-11-18: 1
  Filled 2013-11-18: qty 1

## 2013-11-18 MED ORDER — "INSULIN SYRINGE-NEEDLE U-100 31G X 5/16"" 0.3 ML MISC": Status: DC

## 2013-11-18 MED ORDER — METFORMIN HCL ER 500 MG PO TB24: 500.0000 mg | ORAL_TABLET | Freq: Every day | ORAL | Status: DC

## 2013-11-18 MED ORDER — INSULIN ASPART 100 UNIT/ML ~~LOC~~ SOLN
0.0000 [IU] | Freq: Three times a day (TID) | SUBCUTANEOUS | Status: DC
  Administered 2013-11-18: 1 [IU] via SUBCUTANEOUS

## 2013-11-18 MED ORDER — INSULIN NPH (HUMAN) (ISOPHANE) 100 UNIT/ML ~~LOC~~ SUSP
5.0000 [IU] | Freq: Two times a day (BID) | SUBCUTANEOUS | Status: DC
  Filled 2013-11-18: qty 10

## 2013-11-18 MED ORDER — BD GETTING STARTED TAKE HOME KIT: 3/10ML X 30G SYRINGES: 1.0000 | Freq: Once | Status: DC

## 2013-11-18 MED ORDER — INSULIN NPH (HUMAN) (ISOPHANE) 100 UNIT/ML ~~LOC~~ SUSP: 5.0000 [IU] | Freq: Two times a day (BID) | SUBCUTANEOUS | Status: DC

## 2013-11-18 MED ORDER — BLOOD GLUCOSE METER KIT: PACK | Status: DC

## 2013-11-18 MED ORDER — BD GETTING STARTED TAKE HOME KIT: 1/2ML X 30G SYRINGES
1.0000 | Freq: Once | Status: DC
  Filled 2013-11-18: qty 1

## 2013-11-18 NOTE — Progress Notes (Signed)
Patient npo except water. Had potassium po order. Did not wish to take it, since npo. I notified MD Aundria Rudogers regarding.will contniue  To monitor

## 2013-11-18 NOTE — Discharge Summary (Signed)
Name: Justin Drake MRN: [id number] DOB: 1976/07/22 38 y.o. PCP: No Pcp Per Patient  Date of Admission: 11/17/2013 11:55 AM Date of Discharge: 11/18/2013 Attending Physician: Dr. Murlean Caller Discharge Diagnosis: Principal Problem:   DKA (diabetic ketoacidoses) Active Problems:   DM2 (diabetes mellitus, type 2)  Discharge Medications:   Medication List         bd getting started take home kit Misc  1 kit by Other route once.     bd getting started take home kit Misc  1 kit by Other route once.     BLOOD GLUCOSE METER DISPOSABLE Devi  PLEASE USE TO CHECK YOUR BLOOD SUGARS THREE TIMES A DAY     Blood Glucose Meter kit  Use as instructed     insulin NPH Human 100 UNIT/ML injection  Commonly known as:  NOVOLIN N RELION  Inject 0.05 mLs (5 Units total) into the skin 2 (two) times daily before a meal.     Insulin Syringe-Needle U-100 31G X 5/16" 0.3 ML Misc  Commonly known as:  RELION INSULIN SYR 0.3ML/31G  USE TO DRAW UP INSULIN, 5 UNITS NPH BID     Lancets Misc  PLEASE USE FOR CHECKING BLOOD SUGAR THREE TIMES A DAY     metFORMIN 500 MG 24 hr tablet  Commonly known as:  GLUCOPHAGE-XR  Take 1 tablet (500 mg total) by mouth daily with breakfast.       Disposition and follow-up:   [name] Justin Drake [facility] was discharged from [facility] in stable condition.  At the hospital follow up visit please address:  DKA in setting of newly diagnosed DM2--Hb A1C 13.2. Started on NPH 5 units BID and metformin XR 529m qd. Needs to meet with diabetes educator as well.   2.  Labs / imaging needed at time of follow-up: bmet--check K, cbg monitoring  3.  Pending labs/ test needing follow-up: none   Follow-up Appointments:     Follow-up Information   Follow up with MCresenciano Genre MD On 11/27/2013. (4/29 at 3:00 with the diabetes coordinator, then Dr. MAundra Dubinat 3:30)    Specialty:  Internal Medicine   Contact information:   [address]  [phone]     Discharge Instructions: Discharge Orders   Future Appointments Provider Department Dept Phone   11/27/2013 3:00 PM [place] [facility] [phone]  11/27/2013 3:30 PM TCresenciano Genre MD [phone]  Future Orders Complete By Expires   Call MD for:  difficulty breathing, headache or visual disturbances  As directed    Call MD for:  extreme fatigue  As directed    Call MD for:  persistant dizziness or light-headedness  As directed    Call MD for:  persistant nausea and vomiting  As directed    Diet Carb Modified  As directed    Increase activity slowly  As directed      Admission HPI: Mr. Justin OTTEYis a 38y.o. male w/ PMHx of Asthma, presents to the ED w/ complaints of weakness. The patient claims he has felt generally unwell for the past 2-3 weeks, starting w/ a cold, consisting of URI symptoms, which then progressed into oral [facility]/ N[facility]. During this same period of time, the patient admits to polyuria, significant polydipsia, fatigue, decreased appetite, nausea, and weight loss. The patient claims  he has lost ~15 lbs in the past 3-4 weeks. He denies any h/o DM or other past medical history, and does not take any medications. He also denies any h/o alcohol use, drug abuse or h/o smoking. Patient also denies recent h/o fever, chills, abdominal pain, vomiting, diarrhea, constipation, chest pain, SOB, dizziness, lightheadedness, or palpitations.  On arrival to the ED, patient noted to have CBG's in the 400's, and AG of 21, HCO3 of 19. UA also showed glucose >1000 and ketones >80.   Hospital Course by problem list: Principal Problem:   DKA (diabetic ketoacidoses) Active Problems:   DM2 (diabetes mellitus, type 2)   DKA in setting of newly diagnosed in DM2--admitted with hyperglycemia, AG 21, glucose of 426, glucosuria, and >80  ketones.  CBG's improved with IVF hydration and 8 units of novolog.  He did not have any nausea or vomiting.  He was not placed on insulin drip and was controlled with insulin sliding scale q4 hours initially and lantus 5 units.  AG was 15 on day of discharge with CO2 of 21 and potassium was also replaced during admission.  He was initially kept NPO and then transitioned to carb modified diet day of discharge that was tolerated without any complaints.  CBG morning of discharge was 150 and diabetes coordinator was consulted who helped provide further education in regards to new diagnosis of diabetes and management.  The decision was made to transition him to NPH 5 units BID along with metformin 580m xr daily for now which will be both affordable and hopefully provide better glycemic control at home.  He plans to follow up with Dr. BCriss Rosalesfor pcp, where his mother goes, however, due to affordability, [facility] educator, DDebera Lat  Diabetes starter kit along with all supplies were ordered on discharge, he was educated again on insulin administration, understanding of diabetes, diet, and lifestyle modification.  He is in transition for getting insurance, and thus, his medication management may change with affordability.  He is encouraged to check his blood sugars three times a day and bring those values to his clinic visits along with his medications.   Discharge Vitals:   BP 112/74  Pulse 65  Temp(Src) 98.2 F (36.8 C) (Oral)  Resp 16  Ht _0  (1.753 m)  Wt 230 lb 13.2 oz (104.7 kg)  BMI 34.07 kg/m2  SpO2 97%  Discharge Labs:  Results for orders placed during the hospital encounter of 11/17/13 (from the past 24 hour(s))  BASIC METABOLIC PANEL     Status: Abnormal   Collection Time    11/18/13 12:13 AM      Result Value Ref Range   Sodium 135 (*) 137 - 147 mEq/L   Potassium 3.6 (*) 3.7 - 5.3 mEq/L   Chloride 99  96 - 112 mEq/L   CO2  18 (*) 19 - 32 mEq/L   Glucose, Bld 211 (*) 70 - 99 mg/dL   BUN 9  6 - 23 mg/dL   Creatinine, Ser 0.75  0.50 - 1.35 mg/dL   Calcium 8.5  8.4 - 10.5 mg/dL   GFR calc non Af Amer >90  >90 mL/min   GFR calc Af Amer >90  >90 mL/min  GLUCOSE, CAPILLARY     Status: Abnormal   Collection Time    11/18/13 12:24 AM      Result Value Ref Range   Glucose-Capillary 210 (*) 70 - 99  mg/dL  GLUCOSE, CAPILLARY     Status: Abnormal   Collection Time    11/18/13  4:02 AM      Result Value Ref Range   Glucose-Capillary 206 (*) 70 - 99 mg/dL  BASIC METABOLIC PANEL     Status: Abnormal   Collection Time    11/18/13  4:39 AM      Result Value Ref Range   Sodium 137  137 - 147 mEq/L   Potassium 3.4 (*) 3.7 - 5.3 mEq/L   Chloride 102  96 - 112 mEq/L   CO2 20  19 - 32 mEq/L   Glucose, Bld 178 (*) 70 - 99 mg/dL   BUN 9  6 - 23 mg/dL   Creatinine, Ser 0.74  0.50 - 1.35 mg/dL   Calcium 8.5  8.4 - 10.5 mg/dL   GFR calc non Af Amer >90  >90 mL/min   GFR calc Af Amer >90  >90 mL/min  BASIC METABOLIC PANEL     Status: Abnormal   Collection Time    11/18/13  7:23 AM      Result Value Ref Range   Sodium 139  137 - 147 mEq/L   Potassium 3.6 (*) 3.7 - 5.3 mEq/L   Chloride 103  96 - 112 mEq/L   CO2 21  19 - 32 mEq/L   Glucose, Bld 151 (*) 70 - 99 mg/dL   BUN 8  6 - 23 mg/dL   Creatinine, Ser 0.77  0.50 - 1.35 mg/dL   Calcium 8.7  8.4 - 10.5 mg/dL   GFR calc non Af Amer >90  >90 mL/min   GFR calc Af Amer >90  >90 mL/min  GLUCOSE, CAPILLARY     Status: Abnormal   Collection Time    11/18/13  8:02 AM      Result Value Ref Range   Glucose-Capillary 150 (*) 70 - 99 mg/dL  GLUCOSE, CAPILLARY     Status: Abnormal   Collection Time    11/18/13 11:22 AM      Result Value Ref Range   Glucose-Capillary 150 (*) 70 - 99 mg/dL  MAGNESIUM     Status: None   Collection Time    11/18/13 12:00 PM      Result Value Ref Range   Magnesium 1.9  1.5 - 2.5 mg/dL   Signed: Jerene Pitch, MD 11/18/2013, 10:15 PM    Time Spent on Discharge: 60 minutes Services Ordered on Discharge: none Equipment Ordered on Discharge: diabetes starter kit

## 2013-11-18 NOTE — Discharge Instructions (Signed)
Please start NPH insulin 5 units twice a day as prescribed.  Since you have gotten a lunch time dose today before leaving the hospital, check your blood sugar tonight, if >200, take the 5 units NPH but if it is >200, do not take the insulin tonight and start fresh tomorrow.   Ask the pharmacist ReliON Novolin N brand if that is not already give  Check your blood sugars three times a day and record your values and bring to follow up visit  If you have high blood sugars again >200 call the clinic [phone] for questions about your insulin  If you have low blood sugars <70, DO NOT TAKE ANY MORE INSULIN AT THAT TIME AND BRING UP YOUR BLOOD SUGAR BY DRINKING JUICE, EATING GLUCOSE TABLET, OR SWEET CANDY AND CHECK YOUR BLOOD SUGAR AGAIN.  If you feel sweaty, light-headed, faint, chest pain, nausea, vomiting, shortness of breath, abdominal pain, your blood sugar may be low and you need to check it right away and call the clinic right away [phone] or go to ED if severe.  These are concerning signs of hypoglycemia and need to be corrected immediately.   You will need to follow up with the internal medicine outpatient clinic until you can establish with Dr. Parke Simmers. We are located on the ground floor of the hospital.   Low Blood Sugar Low blood sugar (hypoglycemia) means that the level of sugar in your blood is lower than it should be. Signs of low blood sugar include:  Getting sweaty.  Feeling hungry.  Feeling dizzy or weak.  Feeling sleepier than normal.  Feeling nervous.  Headaches.  Having a fast heartbeat. Low blood sugar can happen fast and can be an emergency. Your doctor can do tests to check your blood sugar level. You can have low blood sugar and not have diabetes. HOME CARE  Check your blood sugar as told by your doctor. If it is less than 70 mg/dl or as told by your doctor, take 1 of the following:  3 to 4 glucose tablets.   cup clear juice.   cup soda pop, not diet.  1  cup milk.  5 to 6 hard candies.  Recheck blood sugar after 15 minutes. Repeat until it is at the right level.  Eat a snack if it is more than 1 hour until the next meal.  Only take medicine as told by your doctor.  Do not skip meals. Eat on time.  Do not drink alcohol except with meals.  Check your blood glucose before driving.  Check your blood glucose before and after exercise.  Always carry treatment with you, such as glucose pills.  Always wear a medical alert bracelet if you have diabetes. GET HELP RIGHT AWAY IF:   Your blood glucose goes below 70 mg/dl or as told by your doctor, and you:  Are confused.  Are not able to swallow.  Pass out (faint).  You cannot treat yourself. You may need someone to help you.  You have low blood sugar problems often.  You have problems from your medicines.  You are not feeling better after 3 to 4 days.  You have vision changes. MAKE SURE YOU:   Understand these instructions.  Will watch this condition.  Will get help right away if you are not doing well or get worse. Document Released: 10/12/2009 Document Revised: 10/10/2011 Document Reviewed: 10/12/2009 [facility] Patient Information 2014 [place], [place].  Type 2 Diabetes Mellitus, Adult Type 2 diabetes mellitus is  a long-term (chronic) disease. In type 2 diabetes:  The pancreas does not make enough of a hormone called insulin.  The cells in the body do not respond as well to the insulin that is made.  Both of the above can happen. Normally, insulin moves sugars from food into tissue cells. This gives you energy. If you have type 2 diabetes, sugars cannot be moved into tissue cells. This causes high blood sugar (hyperglycemia).  HOME CARE  Have your hemoglobin A1c level checked twice a year. The level shows if your diabetes is under control or out of control.  Perform daily blood sugar testing as told by your doctor.  Check your ketone levels by testing your pee  (urine) when you are sick and as told.  Take your diabetes or insulin medicine as told by your doctor.  Never run out of insulin.  Adjust how much insulin you give yourself based on how many carbs (carbohydrates) you eat. Carbs are in many foods, such as fruits, vegetables, whole grains, and dairy products.  Have a healthy snack between every healthy meal. Have 3 meals and 3 snacks a day.  Lose weight if you are overweight.  Carry a medical alert card or wear your medical alert jewelry.  Carry a 15 gram carb snack with you at all times. Examples include:  Glucose pills, 3 or 4.  Glucose gel, 15 gram tube.  Raisins, 2 tablespoons (24 grams).  Jelly beans, 6.  Animal crackers, 8.  Sugar pop, 4 ounces (120 milliliters).  Gummy treats, 9.  Notice low blood sugar (hypoglycemia) symptoms, such as:  Shaking (tremors).  Decreased ability to think clearly.  Sweating.  Increased heart rate.  Headache.  Dry mouth.  Hunger.  Crabbiness (irritability).  Being worried or tense (anxiety).  Restless sleep.  A change in speech or coordination.  Confusion.  Treat low blood sugar right away. If you are alert and can swallow, follow the 15:15 rule:  Take 15 20 grams of a rapid-acting glucose or carb. This includes glucose gel, glucose pills, or 4 ounces (120 milliliters) of fruit juice, regular pop, or low-fat milk.  Check your blood sugar level after taking the glucose.  Take 15 20 grams of more glucose if the repeat blood sugar level is still 70 mg/dL (milligrams/deciliter) or below.  Eat a meal or snack within 1 hour of the blood sugar levels going back to normal.  Notice early symptoms of high blood sugar, such as:  Being really thirsty or drinking a lot (polydipsia).  Peeing (urinating) a lot (polyuria).  Do at least 150 minutes of physical activity a week or as told.  Split the 150 minutes of activity up during the week. Do not do 150 minutes of activity in  one day.  Perform exercises, such as weight lifting, at least 2 times a week or as told.  Adjust your insulin or food intake as needed if you start a new exercise or sport.  Follow your sick day plan when you are not able to eat or drink as usual.  Avoid tobacco use.  Women who are not pregnant should drink no more than 1 drink a day. Men should drink no more than 2 drinks a day.  Only drink alcohol with food.  Ask your doctor if alcohol is safe for you.  Tell your doctor if you drink alcohol several times during the week.  See your doctor regularly.  Schedule an eye exam soon after you are diagnosed with  diabetes. Schedule exams once every year.  Check your skin and feet every day. Check for cuts, bruises, redness, nail problems, bleeding, blisters, or sores. A doctor should do a foot exam once a year.  Brush your teeth and gums twice a day. Floss once a day. Visit your dentist regularly.  Share your diabetes plan with your workplace or school.  Stay up-to-date with shots that fight against diseases (immunizations).  Learn how to manage stress.  Get diabetes education and support as needed.  Ask your doctor for special help if:  You need help to maintain or improve how you to do things on your own.  You need help to maintain or improve the quality of your life.  You have foot or hand problems.  You have trouble cleaning yourself, dressing, eating, or doing physical activity. GET HELP RIGHT AWAY IF:  You have trouble breathing.  You have moderate to large ketone levels.  You are unable to eat food or drink fluids for more than 6 hours.  You feel sick to your stomach (nauseous) or throw up (vomit) for more than 6 hours.  Your blood sugar level is over 240 mg/dL.  There is a change in mental status.  You get another serious illness.  You have watery poop (diarrhea) for more than 6 hours.  You have been sick or have had a fever for 2 or more days and are not  getting better.  You have pain when you are physically active. MAKE SURE YOU:  Understand these instructions.  Will watch your condition.  Will get help right away if you are not doing well or get worse. Document Released: 04/26/2008 Document Revised: 05/08/2013 Document Reviewed: 11/16/2012 [facility] Patient Information 2014 [place], [place].  Isophane Insulin (NPH) injection What is this medicine? ISOPHANE INSULIN (NPH) (EYE soe fane IN su lin) is a human-made form of insulin. This medicine lowers the amount of sugar in the blood. It is an intermediate-acting insulin that starts working about 1.5 hours after it is injected. This medicine may be used for other purposes; ask your health care provider or pharmacist if you have questions. COMMON BRAND NAME(S): Humulin N, Novolin N, ReliOn What should I tell my health care provider before I take this medicine? They need to know if you have any of these conditions: -episodes of hypoglycemia -kidney disease -liver disease -an unusual or allergic reaction to insulin, metacresol, other medicines, foods, dyes, or preservatives -pregnant or trying to get pregnant -breast-feeding How should I use this medicine? Insulin is for injection under the skin. Use exactly as directed. It is important to follow the directions given to you by your health care professional or doctor. You will be taught how to use this medicine and how to adjust doses for activities and illness. Do not use more insulin than prescribed. Do not use more or less often than prescribed. Always check the appearance of your insulin before using it. This medicine should be white and cloudy. Do not use it if it is not uniformly cloudy after mixing. It is important that you put your used needles and syringes in a special sharps container. Do not put them in a trash can. If you do not have a sharps container, call your pharmacist or healthcare provider to get one. Talk to your  pediatrician regarding the use of this medicine in children. While this drug may be prescribed for children for selected conditions, precautions do apply. Overdosage: If you think you have taken too much  of this medicine contact a poison control center or emergency room at once. NOTE: This medicine is only for you. Do not share this medicine with others. What if I miss a dose? It is important not to miss a dose. Your health care professional or doctor should discuss a plan for missed doses with you. If you do miss a dose, follow their plan. Do not take double doses. What may interact with this medicine? -other medicines for diabetes Many medications may cause an increase or decrease in blood sugar, these include: -alcohol containing beverages -aspirin and aspirin-like drugs -chloramphenicol -chromium -diuretics -male hormones, like estrogens or progestins and birth control pills -heart medicines -isoniazid -male hormones or anabolic steroids -medicines for weight loss -medicines for allergies, asthma, cold, or cough -medicines for mental problems -medicines called MAO Inhibitors like Nardil, Parnate, Marplan, Eldepryl -niacin -NSAIDs, medicines for pain and inflammation, like ibuprofen or naproxen -pentamidine -phenytoin -probenecid -quinolone antibiotics like ciprofloxacin, levofloxacin, ofloxacin -some herbal dietary supplements -steroid medicines like prednisone or cortisone -thyroid medicine Some medications can hide the warning symptoms of low blood sugar. You may need to monitor your blood sugar more closely if you are taking one of these medications. These include: -beta-blockers such as atenolol, metoprolol, propranolol -clonidine -guanethidine -reserpine This list may not describe all possible interactions. Give your health care provider a list of all the medicines, herbs, non-prescription drugs, or dietary supplements you use. Also tell them if you smoke, drink alcohol,  or use illegal drugs. Some items may interact with your medicine. What should I watch for while using this medicine? Visit your health care professional or doctor for regular checks on your progress. A test called the HbA1C (A1C) will be monitored. This is a simple blood test. It measures your blood sugar control over the last 2 to 3 months. You will receive this test every 3 to 6 months. Learn how to check your blood sugar. Learn the symptoms of low and high blood sugar and how to manage them. Always carry a quick-source of sugar with you in case you have symptoms of low blood sugar. Examples include hard sugar candy or glucose tablets. Make sure others know that you can choke if you eat or drink when you develop serious symptoms of low blood sugar, such as seizures or unconsciousness. They must get medical help at once. Tell your doctor or health care professional if you have high blood sugar. You might need to change the dose of your medicine. If you are sick or exercising more than usual, you might need to change the dose of your medicine. Do not skip meals. Ask your doctor or health care professional if you should avoid alcohol. Many nonprescription cough and cold products contain sugar or alcohol. These can affect blood sugar. Make sure that you have the right kind of syringe for the type of insulin you use. Try not to change the brand and type of insulin or syringe unless your health care professional or doctor tells you to. Switching insulin brand or type can cause dangerously high or low blood sugar. Always keep an extra supply of insulin, syringes, and needles on hand. Use a syringe one time only. Throw away syringe and needle in a closed container to prevent accidental needle sticks. Insulin pens and cartridges should never be shared. Sharing may result in passing of viruses like hepatitis or HIV. Wear a medical ID bracelet or chain, and carry a card that describes your disease and details of  your  medicine and dosage times. What side effects may I notice from receiving this medicine? Side effects that you should report to your health care professional or doctor as soon as possible: -allergic reactions like skin rash, itching or hives, swelling of the face, lips, or tongue -breathing problems -signs and symptoms of high blood sugar such as dizziness, dry mouth, dry skin, fruity breath, nausea, stomach pain, increased hunger or thirst, increased urination -signs and symptoms of low blood sugar such as feeling anxious, confusion, dizziness, increased hunger, unusually weak or tired, sweating, shakiness, cold, irritable, headache, blurred vision, fast heartbeat, loss of consciousness Side effects that usually do not require medical attention (report to your health care professional or doctor if they continue or are bothersome): -increase or decrease in fatty tissue under the skin due to overuse of a particular injection site -itching, burning, swelling, or rash at site where injected This list may not describe all possible side effects. Call your doctor for medical advice about side effects. You may report side effects to FDA at 1-800-FDA-1088. Where should I keep my medicine? Keep out of the reach of children. Store unopened insulin vials in a refrigerator between 2 and 8 degrees C (36 and 46 degrees F). Do not freeze or use if the insulin has been frozen. Store opened insulin vials in the refrigerator or at room temperature below 30 degrees C (86 degrees F). Keeping your insulin at room temperature decreases the amount of pain during injection. Do not use opened Humulin N brand vials after the expiration date printed on the vial. If you are using Novolin N brand vials, your opened insulin vial should be thrown away after 42 days. Protect from heat and light. Store unopened prefilled insulin pens in a refrigerator between 2 and 8 degrees C (36 and 46 degrees F.) Do not freeze or use if the  insulin has been frozen. After opening, keep this medicine at room temperature below 30 degrees C (86 degrees F). Do not store in the refrigerator. Protect from heat and light. Throw away any unused medicine 14 days after opening. NOTE: This sheet is a summary. It may not cover all possible information. If you have questions about this medicine, talk to your doctor, pharmacist, or health care provider.  2014, [employer]. (2012-10-31 13:29:39)   Blood Glucose Monitoring, Adult Monitoring your blood glucose (also know as blood sugar) helps you to manage your diabetes. It also helps you and your health care provider monitor your diabetes and determine how well your treatment plan is working. WHY SHOULD YOU MONITOR YOUR BLOOD GLUCOSE?  It can help you understand how food, exercise, and medicine affect your blood glucose.  It allows you to know what your blood glucose is at any given moment. You can quickly tell if you are having low blood glucose (hypoglycemia) or high blood glucose (hyperglycemia).  It can help you and your health care provider know how to adjust your medicines.  It can help you understand how to manage an illness or adjust medicine for exercise. WHEN SHOULD YOU TEST? Your health care provider will help you decide how often you should check your blood glucose. This may depend on the type of diabetes you have, your diabetes control, or the types of medicines you are taking. Be sure to write down all of your blood glucose readings so that this information can be reviewed with your health care provider. See below for examples of testing times that your health care provider may suggest.  Type 1 Diabetes  Test 4 times a day if you are in good control, using an insulin pump, or perform multiple daily injections.  If your diabetes is not well-controlled or if you are sick, you may need to monitor more often.  It is a good idea to also monitor:  Before and after  exercise.  Between meals and 2 hours after a meal.  Occasionally between 2:00 to 3:00 am. Type 2 Diabetes  It can vary with each person, but generally, if you are on insulin, test 4 times a day.  If you take medicines by mouth (orally), test 2 times a day.  If you are on a controlled diet, test once a day.  If your diabetes is not well controlled or if you are sick, you may need to monitor more often. HOW TO MONITOR YOUR BLOOD GLUCOSE Supplies Needed  Blood glucose meter.  Test strips for your meter. Each meter has its own strips. You must use the strips that go with your own meter.  A pricking needle (lancet).  A device that holds the lancet (lancing device).  A journal or log book to write down your results. Procedure  Wash your hands with soap and water. Alcohol is not preferred.  Prick the side of your finger (not the tip) with the lancet.  Gently milk the finger until a small drop of blood appears.  Follow the instructions that come with your meter for inserting the test strip, applying blood to the strip, and using your blood glucose meter. Other Areas to Get Blood for Testing Some meters allow you to use other areas of your body (other than your finger) to test your blood. These areas are called alternative sites. The most common alternative sites are:  The forearm.  The thigh.  The back area of the lower leg.  The palm of the hand. The blood flow in these areas is slower. Therefore, the blood glucose values you get may be delayed, and the numbers are different from what you would get from your fingers. Do not use alternative sites if you think you are having hypoglycemia. Your reading will not be accurate. Always use a finger if you are having hypoglycemia. Also, if you cannot feel your lows (hypoglycemia unawareness), always use your fingers for your blood glucose checks. ADDITIONAL TIPS FOR GLUCOSE MONITORING  Do not reuse lancets.  Always carry your  supplies with you.  All blood glucose meters have a 24-hour "hotline" number to call if you have questions or need help.  Adjust (calibrate) your blood glucose meter with a control solution after finishing a few boxes of strips. BLOOD GLUCOSE RECORD KEEPING It is a good idea to keep a daily record or log of your blood glucose readings. Most glucose meters, if not all, keep your glucose records stored in the meter. Some meters come with the ability to download your records to your home computer. Keeping a record of your blood glucose readings is especially helpful if you are wanting to look for patterns. Make notes to go along with the blood glucose readings because you might forget what happened at that exact time. Keeping good records helps you and your health care provider to work together to achieve good diabetes management.  Document Released: 07/21/2003 Document Revised: 03/20/2013 Document Reviewed: 12/10/2012 [facility] Patient Information 2014 [place], [place]. Diabetes Meal Planning Guide The diabetes meal planning guide is a tool to help you plan your meals and snacks. It is important for people  with diabetes to manage their blood glucose (sugar) levels. Choosing the right foods and the right amounts throughout your day will help control your blood glucose. Eating right can even help you improve your blood pressure and reach or maintain a healthy weight. CARBOHYDRATE COUNTING MADE EASY When you eat carbohydrates, they turn to sugar. This raises your blood glucose level. Counting carbohydrates can help you control this level so you feel better. When you plan your meals by counting carbohydrates, you can have more flexibility in what you eat and balance your medicine with your food intake. Carbohydrate counting simply means adding up the total amount of carbohydrate grams in your meals and snacks. Try to eat about the same amount at each meal. Foods with carbohydrates are listed below. Each portion  below is 1 carbohydrate serving or 15 grams of carbohydrates. Ask your dietician how many grams of carbohydrates you should eat at each meal or snack. Grains and Starches  1 slice bread.   English muffin or hotdog/hamburger bun.   cup cold cereal (unsweetened).   cup cooked pasta or rice.   cup starchy vegetables (corn, potatoes, peas, beans, winter squash).  1 tortilla (6 inches).   bagel.  1 waffle or pancake (size of a CD).   cup cooked cereal.  4 to 6 small crackers. *Whole grain is recommended. Fruit  1 cup fresh unsweetened berries, melon, papaya, pineapple.  1 small fresh fruit.   banana or mango.   cup fruit juice (4 oz unsweetened).   cup canned fruit in natural juice or water.  2 tbs dried fruit.  12 to 15 grapes or cherries. Milk and Yogurt  1 cup fat-free or 1% milk.  1 cup soy milk.  6 oz light yogurt with sugar-free sweetener.  6 oz low-fat soy yogurt.  6 oz plain yogurt. Vegetables  1 cup raw or  cup cooked is counted as 0 carbohydrates or a "free" food.  If you eat 3 or more servings at 1 meal, count them as 1 carbohydrate serving. Other Carbohydrates   oz chips or pretzels.   cup ice cream or frozen yogurt.   cup sherbet or sorbet.  2 inch square cake, no frosting.  1 tbs honey, sugar, jam, jelly, or syrup.  2 small cookies.  3 squares of graham crackers.  3 cups popcorn.  6 crackers.  1 cup broth-based soup.  Count 1 cup casserole or other mixed foods as 2 carbohydrate servings.  Foods with less than 20 calories in a serving may be counted as 0 carbohydrates or a "free" food. You may want to purchase a book or computer software that lists the carbohydrate gram counts of different foods. In addition, the nutrition facts panel on the labels of the foods you eat are a good source of this information. The label will tell you how big the serving size is and the total number of carbohydrate grams you will be eating  per serving. Divide this number by 15 to obtain the number of carbohydrate servings in a portion. Remember, 1 carbohydrate serving equals 15 grams of carbohydrate. SERVING SIZES Measuring foods and serving sizes helps you make sure you are getting the right amount of food. The list below tells how big or small some common serving sizes are.  1 oz.........4 stacked dice.  3 oz........Marland KitchenDeck of cards.  1 tsp.......Marland KitchenTip of little finger.  1 tbs......Marland KitchenMarland KitchenThumb.  2 tbs.......Marland KitchenGolf ball.   cup......Marland KitchenHalf of a fist.  1 cup.......Marland KitchenA fist. SAMPLE DIABETES MEAL PLAN  Below is a sample meal plan that includes foods from the grain and starches, dairy, vegetable, fruit, and meat groups. A dietician can individualize a meal plan to fit your calorie needs and tell you the number of servings needed from each food group. However, controlling the total amount of carbohydrates in your meal or snack is more important than making sure you include all of the food groups at every meal. You may interchange carbohydrate containing foods (dairy, starches, and fruits). The meal plan below is an example of a 2000 calorie diet using carbohydrate counting. This meal plan has 17 carbohydrate servings. Breakfast  1 cup oatmeal (2 carb servings).   cup light yogurt (1 carb serving).  1 cup blueberries (1 carb serving).   cup almonds. Snack  1 large apple (2 carb servings).  1 low-fat string cheese stick. Lunch  Chicken breast salad.  1 cup spinach.   cup chopped tomatoes.  2 oz chicken breast, sliced.  2 tbs low-fat [place] dressing.  12 whole-wheat crackers (2 carb servings).  12 to 15 grapes (1 carb serving).  1 cup low-fat milk (1 carb serving). Snack  1 cup carrots.   cup hummus (1 carb serving). Dinner  3 oz broiled salmon.  1 cup brown rice (3 carb servings). Snack  1  cups steamed broccoli (1 carb serving) drizzled with 1 tsp olive oil and lemon juice.  1 cup light pudding (2  carb servings). DIABETES MEAL PLANNING WORKSHEET Your dietician can use this worksheet to help you decide how many servings of foods and what types of foods are right for you.  BREAKFAST Food Group and Servings / Carb Servings Grain/Starches __________________________________ Dairy __________________________________________ Vegetable ______________________________________ Fruit ___________________________________________ Meat __________________________________________ Fat ____________________________________________ LUNCH Food Group and Servings / Carb Servings Grain/Starches ___________________________________ Dairy ___________________________________________ Fruit ____________________________________________ Meat ___________________________________________ Fat _____________________________________________ Laural Golden Food Group and Servings / Carb Servings Grain/Starches ___________________________________ Dairy ___________________________________________ Fruit ____________________________________________ Meat ___________________________________________ Fat _____________________________________________ SNACKS Food Group and Servings / Carb Servings Grain/Starches ___________________________________ Dairy ___________________________________________ Vegetable _______________________________________ Fruit ____________________________________________ Meat ___________________________________________ Fat _____________________________________________ DAILY TOTALS Starches _________________________ Vegetable ________________________ Fruit ____________________________ Dairy ____________________________ Meat ____________________________ Fat ______________________________ Document Released: 04/14/2005 Document Revised: 10/10/2011 Document Reviewed: 02/23/2009 ExitCare Patient Information 2014 [employer].   Diabetes and Exercise Exercising regularly is important. It is not just  about losing weight. It has many health benefits, such as:  Improving your overall fitness, flexibility, and endurance.  Increasing your bone density.  Helping with weight control.  Decreasing your body fat.  Increasing your muscle strength.  Reducing stress and tension.  Improving your overall health. People with diabetes who exercise gain additional benefits because exercise:  Reduces appetite.  Improves the body's use of blood sugar (glucose).  Helps lower or control blood glucose.  Decreases blood pressure.  Helps control blood lipids (such as cholesterol and triglycerides).  Improves the body's use of the hormone insulin by:  Increasing the body's insulin sensitivity.  Reducing the body's insulin needs.  Decreases the risk for heart disease because exercising:  Lowers cholesterol and triglycerides levels.  Increases the levels of good cholesterol (such as high-density lipoproteins [HDL]) in the body.  Lowers blood glucose levels. YOUR ACTIVITY PLAN  Choose an activity that you enjoy and set realistic goals. Your health care provider or diabetes educator can help you make an activity plan that works for you. You can break activities into 2 or 3 sessions throughout the day. Doing so is as good as one long session. Exercise ideas include:  Taking the dog for a walk.  Taking the stairs instead of the elevator.  Dancing to your favorite song.  Doing your favorite exercise with a friend. RECOMMENDATIONS FOR EXERCISING WITH TYPE 1 OR TYPE 2 DIABETES   Check your blood glucose before exercising. If blood glucose levels are greater than 240 mg/dL, check for urine ketones. Do not exercise if ketones are present.  Avoid injecting insulin into areas of the body that are going to be exercised. For example, avoid injecting insulin into:  The arms when playing tennis.  The legs when jogging.  Keep a record of:  Food intake before and after you exercise.  Expected  peak times of insulin action.  Blood glucose levels before and after you exercise.  The type and amount of exercise you have done.  Review your records with your health care provider. Your health care provider will help you to develop guidelines for adjusting food intake and insulin amounts before and after exercising.  If you take insulin or oral hypoglycemic agents, watch for signs and symptoms of hypoglycemia. They include:  Dizziness.  Shaking.  Sweating.  Chills.  Confusion.  Drink plenty of water while you exercise to prevent dehydration or heat stroke. Body water is lost during exercise and must be replaced.  Talk to your health care provider before starting an exercise program to make sure it is safe for you. Remember, almost any type of activity is better than none. Document Released: 10/08/2003 Document Revised: 03/20/2013 Document Reviewed: 12/25/2012 [facility] Patient Information 2014 [place], [place].

## 2013-11-18 NOTE — H&P (Signed)
INTERNAL MEDICINE TEACHING SERVICE Attending Admission Note  Date: 11/18/2013  Patient name: Justin Drake  Medical record number: [id number]  Date of birth: Jun 07, 1976    I have seen and evaluated Justin SoberAntoine M Recchia and discussed their care with the Residency Team.  38 yr old man with pmhx asthma, presented with weakness, polydipsia, polyuria, weight loss. He recently had a URI with findings of thrush as well. He was found to have CBG in 400 range and an AG of 21. UA with evidence of ketonuria. He has been treated with insulin and IVF, with clinical improvement.  Filed Vitals:   11/18/13 0535  BP: 112/74  Pulse: 65  Temp: 98.2 F (36.8 C)  Resp: 16   GEN: AAOx3, NAD HEENT: EOMI, dry mucosa. CV: S1S2, no m/r/g, RRR PULM: CTA bilat ABD/GI: Soft, NT, +BS, no guarding. LE/UE: 2+ pulses, no edema.  HgbA1C noted to be 13.2%.  -DKA: Improving. Advance diet. He will need insulin, as PO meds will not be enough. Agree with initiation of metformin and NPH 5 units BID with SSI. He will require slow upward titration. Clinically stable. Would expect AG to close. Will need PCP as outpatient.  Jonah BlueAlejandro Kiondre Grenz, DO, FACP [facility] 11/18/2013, 12:08 PM

## 2013-11-18 NOTE — Progress Notes (Signed)
Subjective: [name] was seen and examined at bedside this morning.  He is hungry and would like to eat. He says he feels much better since admission less tired.  He does not have insurance at this time and would like to follow up in [facility] in the meantime and then possibly transition to Dr. Criss Rosales, where his mother goes as well, for establishing pcp. Diabetes coordinator is meeting with them this morning as well.   Objective: Vital signs in last 24 hours: Filed Vitals:   11/17/13 1804 11/17/13 2148 11/18/13 0211 11/18/13 0535  BP: 132/89 137/87 126/85 112/74  Pulse: 71 78 75 65  Temp: 98.3 F (36.8 C) 98.6 F (37 C) 97.9 F (36.6 C) 98.2 F (36.8 C)  TempSrc: Oral Oral Oral Oral  Resp: _0 Height: _1  (1.753 m)     Weight: 230 lb 4.8 oz (104.463 kg)   230 lb 13.2 oz (104.7 kg)  SpO2: 98% 100% 98% 97%   Weight change:   Intake/Output Summary (Last 24 hours) at 11/18/13 1027 Last data filed at 11/18/13 0800  Gross per 24 hour  Intake 2081.25 ml  Output      0 ml  Net 2081.25 ml   Vitals reviewed. General: resting in bed, NAD HEENT: EOMI, wears glasses Cardiac: RRR Pulm: clear to auscultation bilaterally, no wheezes, rales, or rhonchi Abd: soft, nontender, nondistended, BS present Ext: warm and well perfused, moving all 4 extremities Neuro: alert and oriented X3, sensation grossly intact  Lab Results: Basic Metabolic Panel:  Recent Labs Lab 11/18/13 0439 11/18/13 0723  NA 137 139  K 3.4* 3.6*  CL 102 103  CO2 20 21  GLUCOSE 178* 151*  BUN 9 8  CREATININE 0.74 0.77  CALCIUM 8.5 8.7   Liver Function Tests:  Recent Labs Lab 11/17/13 1215  AST 33  ALT 34  ALKPHOS 112  BILITOT 0.6  PROT 7.9  ALBUMIN 4.2   CBC:  Recent Labs Lab 11/17/13 1215  WBC 4.1  HGB 15.2  HCT 41.3  MCV 89.0  PLT 136*   CBG:  Recent Labs Lab 11/17/13 1709 11/17/13 2003 11/17/13 2203 11/18/13 0024 11/18/13 0402 11/18/13 0802  GLUCAP 268* 165* 193*  210* 206* 150*   Hemoglobin A1C:  Recent Labs Lab 11/17/13 1215  HGBA1C 13.2*   Urinalysis:  Recent Labs Lab 11/17/13 1343  COLORURINE YELLOW  LABSPEC 1.039*  PHURINE 5.0  GLUCOSEU >1000*  HGBUR NEGATIVE  BILIRUBINUR NEGATIVE  KETONESUR >80*  PROTEINUR NEGATIVE  UROBILINOGEN 0.2  NITRITE NEGATIVE  LEUKOCYTESUR NEGATIVE   Medications: I have reviewed the patient's current medications. Scheduled Meds: . bd getting started take home kit  1 kit Other Once  . insulin aspart  0-9 Units Subcutaneous 6 times per day  . insulin glargine  5 Units Subcutaneous QHS  . pneumococcal 23 valent vaccine  0.5 mL Intramuscular Tomorrow-1000   Continuous Infusions: . sodium chloride 1,000 mL (11/18/13 0751)   PRN Meds:.ondansetron (ZOFRAN) IV, ondansetron Assessment/Plan: Active Problems:   DKA (diabetic ketoacidoses) Justin Drake is a 38 y.o. male w/ PMHx of Asthma and DM type II (diagnosed this admission), admitted for DKA.   Diabetic Ketoacidosis in setting of newly diagnosed DM2--HbA1C 13.2.  CBGs improved with 8 units novolog and IVF in ED. And given Lantus 5 units in the evening with CBG 150 this morning.  Discussed in detail education with patient and with assistance of diabetes educator. AG remains 15 today  but patient with no vomiting, CBGs <200, and asking for diet.  He is willing to start insulin therapy at this time that will be affordable without insurance and will also follow up in [facility] until he establishes care with Dr. Fransico Setters office.    -carb modified diet -replace K, check mag -transition to NPH 5 units BID for now with SSI sensitive -start metformin 537m xr qd -Zofran prn nausea -HIV non-reactive -appreciate diabetes educator following  DVT/PE PPx- Lovenox [place]  Dispo: d/c home today  The patient does not have a current PCP (No Pcp Per Patient) and [facility].  Plans to eventually follow up with Dr.  BCriss Rosales The patient does have transportation limitations that hinder transportation to clinic appointments.  Services Needed at time of discharge: Y = Yes, Blank = No PT:   OT:   RN:   Equipment:   Other:     LOS: 1 day   SJerene Pitch MD 11/18/2013, 10:27 AM

## 2013-11-18 NOTE — Progress Notes (Addendum)
Inpatient Diabetes Program Recommendations  AACE/ADA: New Consensus Statement on Inpatient Glycemic Control (2013)  Target Ranges:  Prepandial:   less than 140 mg/dL      Peak postprandial:   less than 180 mg/dL (1-2 hours)      Critically ill patients:  140 - 180 mg/dL   Inpatient Diabetes Program Recommendations Insulin - Basal: NPH 5 units with breakfast and at HS  Note: This coordinator spoke with patient concerning new-onset diabetes.  Patient reports a strong family history of diabetes and kidney disease on his mother's side of the family.  He had a sister that died from kidney failure and waiting on kidney transplant.  Survival skills were discussed: take medications as prescribed, monitor blood sugar daily, s/s hypo and hyperglycemia, when to call the MD.  The importance of lifestyle modification (diet & exercise) was stressed.  Patient does not have a PCP and would like to see his mother's PCP BUT he is uninsured and there is concern that the cost will be too high to establish as a new patient.  Consider following with the Internal Medicine Clinic until he obtains insurance coverage.  He will need a RX for meter, strips, lancets and insulin syringes. Recommend adding Metformin 500 mg 1x daily with food.  Also, recommend low dose NPH 5 units with breakfast and HS.  ReliOn Novolin-N would be the most affordable [facility] of pocket [facility] approximately $25 vial. ADDENDUM: this coordinator spoke with patient's significant other to explain Metformin XR "ghost tablet" that may appear in stool.  Also, since NPH dose is being given later in the day recommend not taking it at HS unless CBG>200.  This applies only for tonight's dose.    Thank you  Piedad ClimesGina Raymon Schlarb BSN, RN,CDE Inpatient Diabetes Coordinator [phone] (team pager)

## 2013-11-18 NOTE — Progress Notes (Signed)
Subjective: 38 yo M with newly diagnosed diabetes, admitted with polyuria, polydipsia, and blurred vision, found to be in DKA. He reports less thirst, polyuria today, and blurry vision this morning. No abdominal pain, nausea, or vomiting. His appetite is increasing, and he complained of continued mild headache. He has a family history of diabetes and CKD on his mother's side. We discussed some initial dietary changes and the importance of regular exercise and weight loss. He was interested in getting a [employer] membership and exercising; his wife was present and very supportive. Pt understood importance of establishing primary care for DM management, regular foot and eye exams, etc. He also asked whether he could be managed with pills only. Discussed his A1c of 13.2 and need for daily insulin with a goal of <7.0.  Objective: Vital signs in last 24 hours: Filed Vitals:   11/17/13 1804 11/17/13 2148 11/18/13 0211 11/18/13 0535  BP: 132/89 137/87 126/85 112/74  Pulse: 71 78 75 65  Temp: 98.3 F (36.8 C) 98.6 F (37 C) 97.9 F (36.6 C) 98.2 F (36.8 C)  TempSrc: Oral Oral Oral Oral  Resp: 16 16 16 16   Height: 5\' 9"  (1.753 m)     Weight: 104.463 kg (230 lb 4.8 oz)   104.7 kg (230 lb 13.2 oz)  SpO2: 98% 100% 98% 97%   Weight change:   Intake/Output Summary (Last 24 hours) at 11/18/13 [id number] Last data filed at 11/18/13 0800  Gross per 24 hour  Intake 2081.25 ml  Output      0 ml  Net 2081.25 ml   General appearance: alert, cooperative and appears stated age Head: Normocephalic, without obvious abnormality, atraumatic Lungs: clear to auscultation bilaterally Heart: regular rate and rhythm, S1, S2 normal, no murmur, click, rub or gallop Abdomen: soft, non-tender; bowel sounds normal; no masses,  no organomegaly Extremities: extremities normal, atraumatic, no cyanosis or edema Pulses: 2+ and symmetric  Lab Results: Results for Mcneil SoberWITHERSPOON, Justin M (MRN [id number]) as of 11/18/2013 09:13  Ref. Range 11/17/2013 12:15 11/17/2013 16:11 11/17/2013 18:56 11/18/2013 00:13 11/18/2013 04:39 11/18/2013 07:23  Sodium  137-147 mEq/L 134 (L) 139 139 135 (L) 137 139  Potassium  3.7-5.3 mEq/L 4.6 4.2 3.6 (L) 3.6 (L) 3.4 (L) 3.6 (L)  Chloride  96-112 mEq/L 94 (L) 102 103 99 102 103  CO2  19-32 mEq/L 19 20 21 18  (L) 20 21  Mean Plasma Glucose  <117 mg/dL 478332 (H)       BUN  2-956-23 mg/dL 14 11 10 9 9 8   Creatinine  0.50-1.35 mg/dL 6.210.86 3.080.83 6.570.77 8.460.75 9.620.74 0.77  Calcium  8.4-10.5 mg/dL 9.7 8.8 9.0 8.5 8.5 8.7  GFR calc non Af Amer >90 mL/min >90 >90 >90 >90 >90 >90  GFR calc Af Amer  >90 mL/min >90 >90 >90 >90 >90 >90  Glucose  70-99 mg/dL 952426 (H) 841271 (H) 324213 (H) 211 (H) 178 (H) 151 (H)     11/17/2013 13:45 11/17/2013 15:04 11/17/2013 17:09 11/17/2013 20:03 11/17/2013 22:03 11/18/2013 00:24 11/18/2013 04:02 11/18/2013 08:02  Glucose-Capillary 358 (H) 264 (H) 268 (H) 165 (H) 193 (H) 210 (H) 206 (H) 150 (H)   Studies/Results: No results found.  Medications: I have reviewed the patient's current medications. Scheduled Meds: . enoxaparin (LOVENOX) injection  40 mg Subcutaneous Q24H  . insulin aspart  0-9 Units Subcutaneous 6 times per day  . insulin glargine  5 Units Subcutaneous QHS  . living well with diabetes book   Does not apply Once  .  pneumococcal 23 valent vaccine  0.5 mL Intramuscular Tomorrow-1000  . potassium chloride  10 mEq Intravenous Q1 Hr x 4   Continuous Infusions: . sodium chloride 1,000 mL (11/18/13 0751)   PRN Meds:.ibuprofen, ondansetron (ZOFRAN) IV, ondansetron  Assessment/Plan: 38 yo M with newly diagnosed diabetes, very likely type 2, admitted for DKA.   Active Problems:   DKA (diabetic ketoacidoses)   Suspected Diabetic Ketoacidosis- Symptomatic improvement with IVF, Lantus and Novolog. Presented to the ED w/ CBG of 426, AG of 21, and HCO3 of 19, UA shows >1000 glucose and >80 ketones. Hgb A1c of 13.2. Blood sugars have trended down, now in 150s. Bicarb 21 this morning  with gap of 15. Potassium 3.6. Discussed need for home insulin, in addition to dietary and lifestyle changes. Pt is currently uninsured, will consider 70/30 or NPH home regimen. -diabetes coordinator consulted, appreciate recs -NPH insulin 5 units BID -metformin 500 mg daily -ISS-S w/ q4h CBG's -advance diet  -BMPs q4 hrs  -IVF's; 1/2NS @ 125 ml/hr  -potassium repletion IV, patient could not tolerate po -Zofran prn nausea.  -ibuprofen prn for HA -HIV non-reactive -I/O's   DVT/PE PPx- Lovenox [place]   Dispo: Disposition is deferred at this time, awaiting improvement of current medical problems.  This is a Psychologist, occupationalMedical Student Note.  The care of the patient was discussed with Dr. Virgina OrganQureshi and the assessment and plan formulated with their assistance.  Please see their attached note for official documentation of the daily encounter.   LOS: 1 day   Orland Decustin Eli Amiir Heckard, Med Student 11/18/2013, 9:18 AM

## 2013-11-18 NOTE — Progress Notes (Signed)
NURSING PROGRESS NOTE  Justin Drake [id number] Discharge Data: 11/18/2013 3:47 PM Attending Provider: Dominic Pea, DO PCP:No PCP Per Patient     [name] to be D/C'd Home per MD order.  Discussed with the patient the After Visit Summary and all questions fully answered. All IV's discontinued with no bleeding noted. All belongings returned to patient for patient to take home.   Last Vital Signs:  Blood pressure 112/74, pulse 65, temperature 98.2 F (36.8 C), temperature source Oral, resp. rate 16, height '5\' 9"'  (1.753 m), weight 104.7 kg (230 lb 13.2 oz), SpO2 97.00%.  Discharge Medication List   Medication List         bd getting started take home kit Misc  1 kit by Other route once.     bd getting started take home kit Misc  1 kit by Other route once.     BLOOD GLUCOSE METER DISPOSABLE Devi  PLEASE USE TO CHECK YOUR BLOOD SUGARS THREE TIMES A DAY     Blood Glucose Meter kit  Use as instructed     insulin NPH Human 100 UNIT/ML injection  Commonly known as:  NOVOLIN N RELION  Inject 0.05 mLs (5 Units total) into the skin 2 (two) times daily before a meal.     Insulin Syringe-Needle U-100 31G X 5/16" 0.3 ML Misc  Commonly known as:  RELION INSULIN SYR 0.3ML/31G  USE TO DRAW UP INSULIN, 5 UNITS NPH BID     Lancets Misc  PLEASE USE FOR CHECKING BLOOD SUGAR THREE TIMES A DAY     metFORMIN 500 MG 24 hr tablet  Commonly known as:  GLUCOPHAGE-XR  Take 1 tablet (500 mg total) by mouth daily with breakfast.

## 2013-11-18 NOTE — Progress Notes (Signed)
Visit to patient while in hospital. Will call after patient is discharged to assist with transition of care. 

## 2013-11-18 NOTE — Progress Notes (Signed)
  I have seen and examined the patient, and reviewed the daily progress note by Darcey Noraustin Bosch, MS 4 and discussed the care of the patient with them. Please see my progress note from 11/18/2013 for further details regarding assessment and plan.    Signed:  Darden PalmerSamaya Martino Tompson, MD 11/18/2013, 2:25 PM

## 2013-11-18 NOTE — Plan of Care (Signed)
Problem: Food- and Nutrition-Related Knowledge Deficit (NB-1.1) Goal: Nutrition education Formal process to instruct or train a patient/client in a skill or to impart knowledge to help patients/clients voluntarily manage or modify food choices and eating behavior to maintain or improve health. Outcome: Completed/Met Date Met:  11/18/13  RD consulted for nutrition education regarding diabetes.   Dietary recall reveals: Breakfast: grits, toast, eggs,  Lunch: fast food (hot dog, Japanese, [employer]) Dinner: protein, veggies (starch and non-starch) Beverages: drinks 3-4 glasses 12 oz of juice daily    Lab Results  Component Value Date    HGBA1C 13.2* 11/17/2013    RD provided "Carbohydrate Counting for People with Diabetes" handout from the Academy of Nutrition and Dietetics. Discussed different food groups and their effects on blood sugar, emphasizing carbohydrate-containing foods. Provided list of carbohydrates and recommended serving sizes of common foods.  Discussed importance of controlled and consistent carbohydrate intake throughout the day. Provided examples of ways to balance meals/snacks and encouraged intake of high-fiber, whole grain complex carbohydrates. Teach back method used.  Expect fair compliance.  Body mass index is 34.07 kg/(m^2). Pt meets criteria for Obese Class I based on current BMI.  Current diet order is NPO. Labs and medications reviewed. No further nutrition interventions warranted at this time. RD contact information provided. If additional nutrition issues arise, please re-consult RD.  Samantha Worley MS, RD, LDN Inpatient Registered Dietitian Pager: [phone] After-hours pager: [phone]         

## 2013-11-18 NOTE — Care Management Note (Signed)
    Page 1 of 1   11/18/2013     2:11:57 PM CARE MANAGEMENT NOTE 11/18/2013  Patient:  Justin Drake,Justin Drake   Account Number:  [id number]  Date Initiated:  11/18/2013  Documentation initiated by:  Letha CapeAYLOR,Shaylie Eklund  Subjective/Objective Assessment:   dx dka  admit- lives with sign other.     Action/Plan:   Anticipated DC Date:  11/18/2013   Anticipated DC Plan:  HOME/SELF CARE      DC Planning Services  CM consult      Choice offered to / List presented to:             Status of service:  Completed, signed off Medicare Important Message given?   (If response is "NO", the following Medicare IM given date fields will be blank) Date Medicare IM given:   Date Additional Medicare IM given:    Discharge Disposition:  HOME/SELF CARE  Per UR Regulation:  Reviewed for med. necessity/level of care/duration of stay  If discussed at Long Length of Stay Meetings, dates discussed:    Comments:

## 2013-11-18 NOTE — Plan of Care (Signed)
Problem: Phase I Progression Outcomes Goal: Diabetes Coordinator Consult Outcome: Completed/Met Date Met:  11/18/13 Diabetes coordinator met with patient

## 2013-11-18 NOTE — Progress Notes (Signed)
Patient was provided with teaching for home care and administration of diabetic medications and lifestyle changes.  He is aware of follow-up doctor's visits and where to get all medications and supplies.  Insulin sample kit provided, diabetes glucometer to be picked up at [facility] today.  Patient discharge per MD, NSL d/c with catheter intact.  Patient and significant other walked out, escorted by NT. Vicki Mallet, Student-RN

## 2013-11-19 ENCOUNTER — Telehealth: Payer: Self-pay | Admitting: Dietician

## 2013-11-19 LAB — GLUCOSE, CAPILLARY: GLUCOSE-CAPILLARY: 385 mg/dL — AB (ref 70–99)

## 2013-11-19 NOTE — Telephone Encounter (Signed)
I called Mr. Spada as well this afternoon after speaking with Norm ParcelDonna Plyler and following up on her phone conversations.  He reports feeling well other than having a headache at this time. He did take the 10 units of NPH and will check his blood sugar again in an hour, before dinner, and likely before bed as well. He is aware of hypoglycemic symptoms and will call the after hours pager if there are any issues and I have also instructed him that he can always go back to the Emergency Room if he starts feeling unwell again or has similar symptoms leading up to prior admission.  He says he does not feel that way at all at this time. No thirst or frequent urination and he just took ibuprofen for his headache.  He still plans to see CDE tomorrow morning and I have also arranged for a back up physician appointment for him that will likely be needed for 945am tomorrow morning.  He was discharged on 5 units NPH BID and 500mg  XR metformin, I suspect his insulin dose will likely need to be increased accordingly and he will also bring in his meter and blood sugar values.  He thanked me for the call and is able to relay back verbally to me again our discussion and instructions to go to the ED if his symptoms occur or worsen and that he can always call the after hours pager if needed.  Discussed plan with Dr. Josem KaufmannKlima as well for appointments in the morning.   Signed: Darden PalmerSamaya Arty Lantzy, MD PGY-2, Internal Medicine Resident Pager: [phone]  11/19/2013,4:46 PM

## 2013-11-19 NOTE — Telephone Encounter (Signed)
Calling to assist with transition of care from hospital to home. Discharge date:11-18-13 Call date: 11-19-13 Hospital follow up appointment date: 11-27-13  Discharge medications reviewed: yes.  Able to fill all prescriptions? He says he was. Patient aware of hospital follow up appointments. He was  No problems with transportation. He denies Other problems/concerns: Blood sugar 1130 today was 500 something before lunch, 392 fasting, last night it was high as well, he has taken 5 units NPH twice since being home 11 Pm last night and this am he took 5 units along with the metformin ER 500 mg. Ate grits, eggs, whole grain toast and water for breakfast, lunch was chicken patty and 2 fruit cups, a few french fries and water.  Informed patient to check blood sugars more often- before meals and bed and to drink 2-3 times as much water as he usually drinks.

## 2013-11-19 NOTE — Telephone Encounter (Signed)
CDE spoke with dr. Virgina OrganQureshi.   Called patient per her request: He had just checked his blood sugar. It was 392. He understands to take 10 units NPH now and none tonight, drink lot's of water even it it feels like he is peeing too much and he agreed to come in tomorrow at 8:30 AM to meet with CDE

## 2013-11-20 ENCOUNTER — Ambulatory Visit (INDEPENDENT_AMBULATORY_CARE_PROVIDER_SITE_OTHER): Payer: Self-pay | Admitting: Dietician

## 2013-11-20 ENCOUNTER — Encounter: Payer: Self-pay | Admitting: Dietician

## 2013-11-20 ENCOUNTER — Ambulatory Visit (INDEPENDENT_AMBULATORY_CARE_PROVIDER_SITE_OTHER): Payer: Self-pay | Admitting: Internal Medicine

## 2013-11-20 ENCOUNTER — Encounter: Payer: Self-pay | Admitting: Internal Medicine

## 2013-11-20 VITALS — BP 121/79 | HR 76 | Temp 97.8°F | Ht 69.5 in | Wt 233.5 lb

## 2013-11-20 VITALS — Ht 69.5 in | Wt 233.1 lb

## 2013-11-20 DIAGNOSIS — E119 Type 2 diabetes mellitus without complications: Secondary | ICD-10-CM

## 2013-11-20 DIAGNOSIS — IMO0001 Reserved for inherently not codable concepts without codable children: Secondary | ICD-10-CM

## 2013-11-20 DIAGNOSIS — E1165 Type 2 diabetes mellitus with hyperglycemia: Secondary | ICD-10-CM

## 2013-11-20 MED ORDER — INSULIN NPH (HUMAN) (ISOPHANE) 100 UNIT/ML ~~LOC~~ SUSP: 10.0000 [IU] | Freq: Two times a day (BID) | SUBCUTANEOUS | Status: DC

## 2013-11-20 NOTE — Assessment & Plan Note (Signed)
New onset Type 2 DM, uncontrolled with HbA1C 13.2 Presented to the ED with DKA and was subsequently admitted to the hospital. Discussed about his care with the attending Dr. Heide SparkNarendra.  Plans: Increase NPH insulin to 10 units in AM and 10 units in PM. Check CBG's four times daily: before each meal and at bedtime. Continue Metformin XR 500 mg for now and consider increasing at the next office visit. Follow up in a week. Educated patient regarding dietary changes, life style changes and the import of weight loss and exercise.

## 2013-11-20 NOTE — Progress Notes (Signed)
Diabetes Self-Management Education  Visit Number: First/Initial  11/20/2013 Justin Drake, identified by name and date of birth, is a 38 y.o. male with Diabetes Type: Type 2.  t:      ASSESSMENT  Patient Concerns:  Glycemic Control  Height 5' 9.5" (1.765 m), weight 233 lb 1.6 oz (105.733 kg). Body mass index is 33.94 kg/(m^2).  Lab Results: Hemoglobin A1C  Date Value Ref Range Status  11/17/2013 13.2* <5.7 % Final     (NOTE)                                                                               Family History  Problem Relation Age of Onset  . Diabetes Mother   . Renal Disease Mother   . Hypertension Mother   . Stroke      great grand mother  . Cancer      great uncle  . Migraines Father   . Diabetes Maternal Aunt   . Kidney disease Maternal Aunt    History  Substance Use Topics  . Smoking status: Never Smoker   . Smokeless tobacco: Never Used  . Alcohol Use: No    Support Systems:  Significant Other;Parents;extended family and friends(family and friends, 2 sons and daughter)  Prior DM Education:  in hospital Special Needs: none noted today Daily Foot Exams:   need to assess at future visit Patient Belief / Attitude about Diabetes:  Diabetes can be controlled  Assessment comments:  Blood sugars high post hospitalization where he received > 30 units in ~ 24 hours to obtain adequate control. Headache is only reported symptom that abated after injecting 10 units yesterday. Patient has good attitude about new diagnosis. Patient told how to check accuarcy of meter with lab and transferred to physician after verbal handoff.  Medications: demonstrated good technique of drawing up 10 units of saline, verbalized understanding  Diet Recall:  Breakfast:Grits, fruit, water Lunch: chicken patty on wheat bread, fruit, few french fires, water Dinner:salad with chicken,  lite asian dressing and beans from [employer] for dinner, water  Individualized Plan for Diabetes  Self-Management Training:  Patient individualized diabetes plan discussed today with patient. Schedule for training not set up yet.   Education Topics Reviewed with Patient Today:  Topic Points Discussed  Disease State Factors that contribute to the development of diabetes  Nutrition Management    Physical Activity and Exercise    Medications Taught/evaluated insulin injection, site rotation, insulin storage and needle disposal.  Monitoring Purpose and frequency of SMBG.  Acute Complications Discussed and identified patients' treatment of hyperglycemia.  Chronic Complications    Psychosocial Adjustment Helped patient identify a support system for diabetes management;Identified and addressed patients feelings and concerns about diabetes  Goal Setting    Preconception Care (if applicable)      PATIENTS GOALS   Learning Objective(s):     Goal The patient agrees to: none set today, Encouraged him to attend at least one visit annually with diabetes Educator  Nutrition    Physical Activity    Medications    Monitoring    Problem Solving    Reducing Risk    Health Coping  PERSONALIZED PLAN / SUPPORT  Self-Management Support:  Doctor's office;Friends;Family;Internet communities;CDE visits  ______________________________________________________________________  Outcomes  Expected Outcomes:  Demonstrated interest in learning. Expect positive outcomes Self-Care Barriers:  Lack of material resources Education material provided: yes If problems or questions, patient to contact team via:  Phone and Email Time in: 0845     Time out: 0930 Future DSME appointment: - 2 wks (has appointment on 11/27/13)   Justin Drake 11/20/2013 10:59 AM

## 2013-11-20 NOTE — Progress Notes (Signed)
Subjective:   Patient ID: Justin Drake male   DOB: 08/22/1975 38 y.o.   MRN: [id number]  HPI: [name] is a 37 y.o. gentleman with PMH of newly diagnosed DM comes to the office for a hospital follow up of his DM.  Patient was admitted to the hospital last week for DKA and was discharged on 11/18/2013 afternoon with a close follow up in the clinic. Patient was discharged home metformin long acting 500 mg once daily and NPH insulin 5 units twice daily.  Patients blood sugars since discharge are as follows.  11/18/13: before dinner 532     Took 5 U NPH at 9 PM 11/19/13: Before breakfast 392    Took 5 U NPH 11/19/13: Before lunch  593 and called the clinic when he was told to take 10 U NPH which he took at 4 PM. 11/19/13 Before dinner 306 and at bedtime 522 didn't take any NPH 11/20/13 Before breakfast 286. Didn't take any NPH in AM.  Patient reports that he has been having diffuse headaches lately and improves with Ibuprofen. He denies any N,V, weakness.   Patient denies any other complaints.    Past Medical History  Diagnosis Date  . Asthma     when young  . Seasonal allergies   . Seasonal allergies   . Back pain, chronic    Current Outpatient Prescriptions  Medication Sig Dispense Refill  . bd getting started take home kit MISC 1 kit by Other route once.      . bd getting started take home kit MISC 1 kit by Other route once.      . Blood Gluc Meter Disp-Strips (BLOOD GLUCOSE METER DISPOSABLE) DEVI PLEASE USE TO CHECK YOUR BLOOD SUGARS THREE TIMES A DAY  100 each  1  . Blood Glucose Monitoring Suppl (BLOOD GLUCOSE METER) kit Use as instructed  1 each  1  . insulin NPH Human (NOVOLIN N RELION) 100 UNIT/ML injection Inject 0.05 mLs (5 Units total) into the skin 2 (two) times daily before a meal.  10 mL  3  . Insulin Syringe-Needle U-100 (RELION INSULIN SYR 0.3ML/31G) 31G X 5/16" 0.3 ML MISC USE TO DRAW UP INSULIN, 5 UNITS NPH BID  100 each  1  . Lancets MISC  PLEASE USE FOR CHECKING BLOOD SUGAR THREE TIMES A DAY  100 each  1  . metFORMIN (GLUCOPHAGE-XR) 500 MG 24 hr tablet Take 1 tablet (500 mg total) by mouth daily with breakfast.  30 tablet  3   No current facility-administered medications for this visit.   Family History  Problem Relation Age of Onset  . Diabetes Mother   . Renal Disease Mother   . Hypertension Mother   . Stroke      great grand mother  . Cancer      great uncle  . Migraines Father   . Diabetes Maternal Aunt   . Kidney disease Maternal Aunt    History   Social History  . Marital Status: Married    Spouse Name: N/A    Number of Children: N/A  . Years of Education: N/A   Occupational History  . Art gallery manager    Social History Main Topics  . Smoking status: Never Smoker   . Smokeless tobacco: Never Used  . Alcohol Use: No  . Drug Use: No  . Sexual Activity: Yes   Other Topics Concern  . None   Social History Chief Financial Officer   Review of  Systems: Pertinent items are noted in HPI. Objective:  Physical Exam: Filed Vitals:   11/20/13 0954  BP: 121/79  Pulse: 76  Temp: 97.8 F (36.6 C)  TempSrc: Oral  Height: 5' 9.5" (1.765 m)  Weight: 233 lb 8 oz (105.915 kg)  SpO2: 100%   Constitutional: Vital signs reviewed.   Patient is a well-developed and well-nourished and is in no acute distress and cooperative with exam. Alert and oriented x3.  Cardiovascular: RRR, S1 normal, S2 normal, no MRG, pulses symmetric and intact bilaterally Pulmonary/Chest: normal respiratory effort, CTAB, no wheezes, rales, or rhonchi Abdominal: Soft. Non-tender, non-distended, bowel sounds are normal, no masses, organomegaly, or guarding present.  Neurological: A&O x3, Non-focal neuro exam. Skin: Warm, dry and intact. No rash, cyanosis, or clubbing.    Assessment & Plan:

## 2013-11-20 NOTE — Discharge Summary (Signed)
  Date: 11/20/2013  Patient name: Justin Drake  Medical record number: [id number]  Date of birth: Dec 18, 1975   This patient has been seen and the plan of care was discussed with the house staff. Please see their note for complete details. I concur with their findings and plan.  Jonah BlueAlejandro Shalin Linders, DO, FACP Faculty [facility] 11/20/2013, 2:42 PM

## 2013-11-20 NOTE — Patient Instructions (Signed)
Take NPH Insulin subcutaneously, 10 units before breakfast and 10 units before dinner. Check your blood sugars four times daily: before breakfast, lunch, dinner and at bedtime. We will see you in a week.

## 2013-11-21 NOTE — Progress Notes (Signed)
INTERNAL MEDICINE TEACHING ATTENDING ADDENDUM - Braidon Chermak, MD: I reviewed and discussed at the time of visit with the resident Dr. Boggala, the patient's medical history, physical examination, diagnosis and results of tests and treatment and I agree with the patient's care as documented. 

## 2013-11-27 ENCOUNTER — Ambulatory Visit (INDEPENDENT_AMBULATORY_CARE_PROVIDER_SITE_OTHER): Payer: Self-pay | Admitting: Internal Medicine

## 2013-11-27 ENCOUNTER — Encounter: Payer: Self-pay | Admitting: Internal Medicine

## 2013-11-27 ENCOUNTER — Ambulatory Visit (INDEPENDENT_AMBULATORY_CARE_PROVIDER_SITE_OTHER): Payer: Self-pay | Admitting: Dietician

## 2013-11-27 VITALS — BP 106/73 | HR 72 | Temp 97.6°F | Ht 69.5 in | Wt 234.2 lb

## 2013-11-27 DIAGNOSIS — E119 Type 2 diabetes mellitus without complications: Secondary | ICD-10-CM

## 2013-11-27 MED ORDER — METFORMIN HCL ER (OSM) 1000 MG PO TB24: 1000.0000 mg | ORAL_TABLET | Freq: Every day | ORAL | Status: DC

## 2013-11-27 MED ORDER — METFORMIN HCL ER 500 MG PO TB24: 500.0000 mg | ORAL_TABLET | Freq: Two times a day (BID) | ORAL | Status: DC

## 2013-11-27 MED ORDER — INSULIN NPH (HUMAN) (ISOPHANE) 100 UNIT/ML ~~LOC~~ SUSP: 15.0000 [IU] | Freq: Two times a day (BID) | SUBCUTANEOUS | Status: DC

## 2013-11-27 MED ORDER — INSULIN NPH (HUMAN) (ISOPHANE) 100 UNIT/ML ~~LOC~~ SUSP: 12.0000 [IU] | Freq: Two times a day (BID) | SUBCUTANEOUS | Status: DC

## 2013-11-27 MED ORDER — METFORMIN HCL ER 500 MG PO TB24: 500.0000 mg | ORAL_TABLET | Freq: Every day | ORAL | Status: DC

## 2013-11-27 NOTE — Assessment & Plan Note (Addendum)
Lab Results  Component Value Date   HGBA1C 13.2* 11/17/2013     Assessment: Diabetes control: poor control (HgbA1C >9%) Progress toward A1C goal:  unchanged Comments: still hyperglycemic according to home readings   Plan: Medications:  increased Novolin N from 10 units bid to 15 units bid (his calculated TDD is 25 basal at least); increased Metfromin ER from 500 mg qd to 1000 mg qam  Home glucose monitoring: Frequency: 3 times a day Timing: before meals Instruction/counseling given: reminded to bring blood glucose meter & log to each visit, reminded to bring medications to each visit, discussed diet and provided printed educational material Educational resources provided: brochure;handout;video Self management tools provided: yes Other plans: patient met with DM educator today;  due for lipid, foot exam, urine micro/Alb cr, eye exam-will defer for now b/c he needs insurance; f/u 2 weeks then 02/16/14  Titrate insulin base on meter readings at next visit

## 2013-11-27 NOTE — Progress Notes (Signed)
Attending physician note: Presenting problem, physical findings, medications, discussed with medical resident Dr. Arbutus Pedraci McLean and I concur with her treatment plan. Justin Drake M.D. FACP

## 2013-11-27 NOTE — Progress Notes (Signed)
   Subjective:    Patient ID: Justin Drake, male    DOB: [birth year], 38 y.o.   MRN: [id number]  HPI Comments: 38 y.o Past Medical History Asthma, Seasonal allergies, DKA, DM 2, FH DM 2,Back pain (chronic)  He presents for f/u for. BP 106/73 HR 72  1) DM 2 -HA1C was 13.2 on 11/17/13.  He is taking Novolin N 10 units bid and Metformin 500 ER daily.  He admits to missing some of the night doses b/c he went to bed instead. cbg at 2:42 was 234.  He states his vision is somewhat better but at times is blurry.  He needs Rx refill of Novolin N.  He brings in his meter and readings mostly are 260s-600s still since last visit when insulin was increased to 10 units bid (no lows) and a range of 131 to 600 glucose readings.     SH: works as a Paediatric nursebarber, no Community education officerinsurance (pending paperwork to [employer]), 3 kids HM: due for lipid, foot exam, urine micro/Alb cr, eye exam-will defer for now b/c he needs insurance                                              Review of Systems  Respiratory: Negative for shortness of breath.   Cardiovascular: Negative for chest pain.  Gastrointestinal: Negative for nausea, vomiting and abdominal pain.       Objective:   Physical Exam  Nursing note and vitals reviewed. Constitutional: He is oriented to person, place, and time. Vital signs are normal. He appears well-developed and well-nourished. He is cooperative.  HENT:  Head: Normocephalic and atraumatic.  Mouth/Throat: Oropharynx is clear and moist and mucous membranes are normal. No oropharyngeal exudate.  Eyes: Conjunctivae are normal. Pupils are equal, round, and reactive to light. Right eye exhibits no discharge. Left eye exhibits no discharge. No scleral icterus.  Cardiovascular: Normal rate, regular rhythm, S1 normal, S2 normal and normal heart sounds.   No murmur heard. No lower ext edema   Pulmonary/Chest: Effort normal and breath sounds normal.  Abdominal: Soft. Bowel sounds are normal. He exhibits no  distension. There is no tenderness.  Musculoskeletal: He exhibits no edema.  Neurological: He is alert and oriented to person, place, and time. Gait normal.  Skin: Skin is warm, dry and intact. No rash noted.  Psychiatric: He has a normal mood and affect. His speech is normal and behavior is normal. Judgment and thought content normal. Cognition and memory are normal.          Assessment & Plan:  F/u no later than 02/16/14 sooner if still hyperglycemic

## 2013-11-27 NOTE — Progress Notes (Signed)
Diabetes Self-Management Education  Visit Number: Follow-up 1  11/27/2013 Mr. Myrtis SerAntoine Mchargue, identified by name and date of birth, is a 38 y.o. male with Diabetes Type: Type 2.       ASSESSMENT  Patient Concerns:  Nutrition/Meal planning;Medication;Healthy Lifestyle   Lab Results: Hemoglobin A1C  Date Value Ref Range Status  11/17/2013 13.2* <5.7 % Final     (NOTE)                                                                               Family History  Problem Relation Age of Onset  . Diabetes Mother   . Renal Disease Mother   . Hypertension Mother   . Kidney disease Mother     on kidney transplant list   . Stroke      great grand mother  . Cancer      great uncle  . Migraines Father   . Diabetes Maternal Aunt   . Kidney disease Maternal Aunt   . Diabetes Maternal Grandmother   . Diabetes Maternal Grandfather   . Kidney disease Maternal Grandfather   . Diabetes Maternal Aunt    History  Substance Use Topics  . Smoking status: Never Smoker   . Smokeless tobacco: Never Used  . Alcohol Use: No     Assessment comments: patient is doing a good job caring for his diabetes and coping with his diagnosis. CBGs are still high but improved with minimal wieght change indicating dietary changes.   Diet Recall: cheerios and lactose free milk, healthy choice bowls, salads with grilled chicken, french fries one day, hamburger, vegetables for dinner.    Individualized Plan for Diabetes Self-Management Training:  Patient individualized diabetes plan discussed today with patient and includes: what is Diabetes, Nutrition, medications, monitoring, physical activity, how to handle highs and lows, Special care for my body when I have diabetes, Dealing daily with diabetes  Education Topics Reviewed with Patient Today:  Topic Points Discussed  Disease State    Nutrition Management Role of diet in the treatment of diabetes and the relationship between the three main  macronutrients and blood glucose level;Food label reading, portion sizes and measuring food.;Carbohydrate counting;Effects of alcohol on blood glucose and safety factors with consumption of alcohol.  Physical Activity and Exercise    Medications Reviewed patients medication for diabetes, action, purpose, timing of dose and side effects.  Monitoring    Acute Complications    Chronic Complications    Psychosocial Adjustment    Goal Setting    Preconception Care (if applicable)      PATIENTS GOALS   Learning Objective(s):     Goal The patient agrees to:  Nutrition Follow meal plan discussed  Physical Activity    Medications    Monitoring    Problem Solving    Reducing Risk    Health Coping     Patient Self-Evaluation of Goals (Subsequent Visits)  Goal The patient rates self as meeting goals (% of time)  Nutrition    Physical Activity    Medications    Monitoring    Problem Solving    Reducing Risk    Health Coping      PERSONALIZED PLAN /  SUPPORT  Self-Management Support:  Doctor's office;Friends;Family;Internet communities;CDE visits ______________________________________________________________________  Outcomes  Expected Outcomes:   Good Self-Care Barriers:  Unable to determine;Lack of material resources Education material provided: yes If problems or questions, patient to contact team via:  Phone and Email Time in: 1630     Time out: 1700  Future DSME appointment: - 4-6 wks   Cecil CrankerDonna M Plyler 11/27/2013 5:09 PM

## 2013-11-27 NOTE — Patient Instructions (Addendum)
General Instructions: Try 15 units of insulin 2x per day and Metformin ER 1000 mg per day  Follow up 2 weeks then no later than 02/16/14 for your diabetes. Check your sugars 3-4 times per day   Treatment Goals:  Goals (1 Years of Data) as of 11/27/13         11/17/13     Result Component    . HEMOGLOBIN A1C < 7.0  13.2    . HEMOGLOBIN A1C < 7.0  13.2      Progress Toward Treatment Goals:  Treatment Goal 11/27/2013  Hemoglobin A1C unchanged    Self Care Goals & Plans:  Self Care Goal 11/27/2013  Manage my medications take my medicines as prescribed; bring my medications to every visit; refill my medications on time; follow the sick day instructions if I am sick  Monitor my health keep track of my blood glucose; bring my glucose meter and log to each visit; check my feet daily  Eat healthy foods eat more vegetables; eat fruit for snacks and desserts; eat foods that are low in salt; eat baked foods instead of fried foods; eat smaller portions; drink diet soda or water instead of juice or soda  Be physically active find an activity I enjoy  Meeting treatment goals maintain the current self-care plan    Home Blood Glucose Monitoring 11/27/2013  Check my blood sugar 3 times a day  When to check my blood sugar before meals     Care Management & Community Referrals:  Referral 11/27/2013  Referrals made for care management support nutritionist  Referrals made to community resources none       Items required to complete an Eligibility Application   1. Picture ID (Can't be expired) 2. Current Bill to establish proof of residency 3. W-2 & Tax return (if self-employed include "Schedule C"), if not filing Form 4506 4. 4 current Pay stubs for this year 5. Printout of other income (Social security, unemployment, child support, workmen's comp) 6. Food stamp award letter, if receiving  7. Life Insurance (Need copy of the front page, showing name Ins Co. Name, and face amount). 8.  Statement for pension, 401-K, IRS (needs to have current balance) 9. Tax Value for cars, houses, mobile homes, and land ([place] from [place] [place]) 10. Disability Paperwork (showing status of case) 11. College students: Print out of [place] received, tuition cost, books, etc. 12. If no Income: Engineer, maintenanceotarized Letters of support for free shelter, money, food, Catering manageretc.  Bring all that you can to your follow up appointment to start the process.  Type 2 Diabetes Mellitus, Adult Type 2 diabetes mellitus is a long-term (chronic) disease. In type 2 diabetes:  The pancreas does not make enough of a hormone called insulin.  The cells in the body do not respond as well to the insulin that is made.  Both of the above can happen. Normally, insulin moves sugars from food into tissue cells. This gives you energy. If you have type 2 diabetes, sugars cannot be moved into tissue cells. This causes high blood sugar (hyperglycemia).  HOME CARE  Have your hemoglobin A1c level checked twice a year. The level shows if your diabetes is under control or out of control.  Perform daily blood sugar testing as told by your doctor.  Check your ketone levels by testing your pee (urine) when you are sick and as told.  Take your diabetes or insulin medicine as told by your doctor.  Never run out  of insulin.  Adjust how much insulin you give yourself based on how many carbs (carbohydrates) you eat. Carbs are in many foods, such as fruits, vegetables, whole grains, and dairy products.  Have a healthy snack between every healthy meal. Have 3 meals and 3 snacks a day.  Lose weight if you are overweight.  Carry a medical alert card or wear your medical alert jewelry.  Carry a 15 gram carb snack with you at all times. Examples include:  Glucose pills, 3 or 4.  Glucose gel, 15 gram tube.  Raisins, 2 tablespoons (24 grams).  Jelly beans, 6.  Animal crackers, 8.  Sugar pop, 4 ounces (120  milliliters).  Gummy treats, 9.  Notice low blood sugar (hypoglycemia) symptoms, such as:  Shaking (tremors).  Decreased ability to think clearly.  Sweating.  Increased heart rate.  Headache.  Dry mouth.  Hunger.  Crabbiness (irritability).  Being worried or tense (anxiety).  Restless sleep.  A change in speech or coordination.  Confusion.  Treat low blood sugar right away. If you are alert and can swallow, follow the 15:15 rule:  Take 15 20 grams of a rapid-acting glucose or carb. This includes glucose gel, glucose pills, or 4 ounces (120 milliliters) of fruit juice, regular pop, or low-fat milk.  Check your blood sugar level after taking the glucose.  Take 15 20 grams of more glucose if the repeat blood sugar level is still 70 mg/dL (milligrams/deciliter) or below.  Eat a meal or snack within 1 hour of the blood sugar levels going back to normal.  Notice early symptoms of high blood sugar, such as:  Being really thirsty or drinking a lot (polydipsia).  Peeing (urinating) a lot (polyuria).  Do at least 150 minutes of physical activity a week or as told.  Split the 150 minutes of activity up during the week. Do not do 150 minutes of activity in one day.  Perform exercises, such as weight lifting, at least 2 times a week or as told.  Adjust your insulin or food intake as needed if you start a new exercise or sport.  Follow your sick day plan when you are not able to eat or drink as usual.  Avoid tobacco use.  Women who are not pregnant should drink no more than 1 drink a day. Men should drink no more than 2 drinks a day.  Only drink alcohol with food.  Ask your doctor if alcohol is safe for you.  Tell your doctor if you drink alcohol several times during the week.  See your doctor regularly.  Schedule an eye exam soon after you are diagnosed with diabetes. Schedule exams once every year.  Check your skin and feet every day. Check for cuts, bruises,  redness, nail problems, bleeding, blisters, or sores. A doctor should do a foot exam once a year.  Brush your teeth and gums twice a day. Floss once a day. Visit your dentist regularly.  Share your diabetes plan with your workplace or school.  Stay up-to-date with shots that fight against diseases (immunizations).  Learn how to manage stress.  Get diabetes education and support as needed.  Ask your doctor for special help if:  You need help to maintain or improve how you to do things on your own.  You need help to maintain or improve the quality of your life.  You have foot or hand problems.  You have trouble cleaning yourself, dressing, eating, or doing physical activity. GET HELP RIGHT AWAY  IF:  You have trouble breathing.  You have moderate to large ketone levels.  You are unable to eat food or drink fluids for more than 6 hours.  You feel sick to your stomach (nauseous) or throw up (vomit) for more than 6 hours.  Your blood sugar level is over 240 mg/dL.  There is a change in mental status.  You get another serious illness.  You have watery poop (diarrhea) for more than 6 hours.  You have been sick or have had a fever for 2 or more days and are not getting better.  You have pain when you are physically active. MAKE SURE YOU:  Understand these instructions.  Will watch your condition.  Will get help right away if you are not doing well or get worse. Document Released: 04/26/2008 Document Revised: 05/08/2013 Document Reviewed: 11/16/2012 [facility] Patient Information 2014 [place], [place].  Exercise to Lose Weight Exercise and a healthy diet may help you lose weight. Your doctor may suggest specific exercises. EXERCISE IDEAS AND TIPS  Choose low-cost things you enjoy doing, such as walking, bicycling, or exercising to workout videos.  Take stairs instead of the elevator.  Walk during your lunch break.  Park your car further away from work or school.  Go  to a gym or an exercise class.  Start with 5 to 10 minutes of exercise each day. Build up to 30 minutes of exercise 4 to 6 days a week.  Wear shoes with good support and comfortable clothes.  Stretch before and after working out.  Work out until you breathe harder and your heart beats faster.  Drink extra water when you exercise.  Do not do so much that you hurt yourself, feel dizzy, or get very short of breath. Exercises that burn about 150 calories:  Running 1  miles in 15 minutes.  Playing volleyball for 45 to 60 minutes.  Washing and waxing a car for 45 to 60 minutes.  Playing touch football for 45 minutes.  Walking 1  miles in 35 minutes.  Pushing a stroller 1  miles in 30 minutes.  Playing basketball for 30 minutes.  Raking leaves for 30 minutes.  Bicycling 5 miles in 30 minutes.  Walking 2 miles in 30 minutes.  Dancing for 30 minutes.  Shoveling snow for 15 minutes.  Swimming laps for 20 minutes.  Walking up stairs for 15 minutes.  Bicycling 4 miles in 15 minutes.  Gardening for 30 to 45 minutes.  Jumping rope for 15 minutes.  Washing windows or floors for 45 to 60 minutes. Document Released: 08/20/2010 Document Revised: 10/10/2011 Document Reviewed: 08/20/2010 [facility] Patient Information 2014 [place], [place].

## 2013-11-27 NOTE — Addendum Note (Signed)
Addended by: Annett GulaMCLEAN, TRACY N on: 11/27/2013 05:02 PM   Modules accepted: Orders, Medications

## 2013-11-28 ENCOUNTER — Telehealth: Payer: Self-pay | Admitting: *Deleted

## 2013-11-28 MED ORDER — INSULIN NPH (HUMAN) (ISOPHANE) 100 UNIT/ML ~~LOC~~ SUSP: 15.0000 [IU] | Freq: Two times a day (BID) | SUBCUTANEOUS | Status: DC

## 2013-11-28 NOTE — Addendum Note (Signed)
Addended by: Annett GulaMCLEAN, Huntley Knoop N on: 11/28/2013 08:16 PM   Modules accepted: Orders, Medications

## 2013-11-28 NOTE — Telephone Encounter (Signed)
Pharmacy calls to clarify novolin n from 4/29 visit sent electronically, the new dose 15 units twice daily was added but old dose was not removed so both went to pharm on same script. Notes reviewed from visit 4/29 and given to pharmacist. Please change med list to reflect only new dose, thank you

## 2014-01-31 ENCOUNTER — Telehealth: Payer: Self-pay | Admitting: Internal Medicine

## 2014-01-31 MED ORDER — METFORMIN HCL ER (OSM) 1000 MG PO TB24: 1000.0000 mg | ORAL_TABLET | Freq: Every day | ORAL | Status: DC

## 2014-01-31 NOTE — Telephone Encounter (Signed)
   Reason for call:   I received a call from [name] at 1415 PM indicating that he needs his metformin refilled. He ran out of his medications today.    Pertinent Data:   Prescribed Metformin 1000mg  24hr tablet on 11/27/13 by Dr. Shirlee LatchMcLean  Last A1C 13.2  Next appt recommended for 02/06/14 by Dr. Shirlee LatchMcLean   Assessment / Plan / Recommendations:   [name] is a 38 year old male with DM2 calling for refill of his metformin.  Metformin was refilled today.  Of note, we are not listed as PCP for patient but [facility] Will send to clinic administrators to address PCP clarification if needed.   As always, pt is advised that if symptoms worsen or new symptoms arise, they should go to an urgent care facility or to to ER for further evaluation.   Darden PalmerSamaya Mirian Casco, MD   01/31/2014, 3:36 PM

## 2014-03-03 ENCOUNTER — Other Ambulatory Visit: Payer: Self-pay | Admitting: *Deleted

## 2014-03-03 NOTE — Telephone Encounter (Signed)
Pt calls for insulin, not seen in clinic since 10/2013, not assigned at this time, spoke w/ charsetta, will assign new pcp.

## 2014-03-04 ENCOUNTER — Ambulatory Visit (INDEPENDENT_AMBULATORY_CARE_PROVIDER_SITE_OTHER): Payer: Self-pay | Admitting: Internal Medicine

## 2014-03-04 ENCOUNTER — Encounter: Payer: Self-pay | Admitting: Internal Medicine

## 2014-03-04 VITALS — BP 112/75 | HR 70 | Temp 98.4°F | Ht 69.9 in | Wt 236.3 lb

## 2014-03-04 DIAGNOSIS — Z0000 Encounter for general adult medical examination without abnormal findings: Secondary | ICD-10-CM

## 2014-03-04 DIAGNOSIS — D696 Thrombocytopenia, unspecified: Secondary | ICD-10-CM

## 2014-03-04 DIAGNOSIS — E119 Type 2 diabetes mellitus without complications: Secondary | ICD-10-CM

## 2014-03-04 LAB — BASIC METABOLIC PANEL WITHOUT GFR
BUN: 10 mg/dL (ref 6–23)
CO2: 29 meq/L (ref 19–32)
Calcium: 9.2 mg/dL (ref 8.4–10.5)
Chloride: 104 meq/L (ref 96–112)
Creat: 0.99 mg/dL (ref 0.50–1.35)
GFR, Est African American: >89 mL/min
GFR, Est Non African American: >89 mL/min
Glucose, Bld: 80 mg/dL (ref 70–99)
Potassium: 3.9 meq/L (ref 3.5–5.3)
Sodium: 140 meq/L (ref 135–145)

## 2014-03-04 LAB — LIPID PANEL
Cholesterol: 122 mg/dL (ref 0–200)
HDL: 37 mg/dL — ABNORMAL LOW
LDL Cholesterol: 38 mg/dL (ref 0–99)
Total CHOL/HDL Ratio: 3.3 ratio
Triglycerides: 233 mg/dL — ABNORMAL HIGH
VLDL: 47 mg/dL — ABNORMAL HIGH (ref 0–40)

## 2014-03-04 LAB — CBC
HCT: 40.9 % (ref 39.0–52.0)
HEMOGLOBIN: 14.4 g/dL (ref 13.0–17.0)
MCH: 31.6 pg (ref 26.0–34.0)
MCHC: 35.2 g/dL (ref 30.0–36.0)
MCV: 89.9 fL (ref 78.0–100.0)
PLATELETS: 141 10*3/uL — AB (ref 150–400)
RBC: 4.55 MIL/uL (ref 4.22–5.81)
RDW: 12.7 % (ref 11.5–15.5)
WBC: 3.8 10*3/uL — ABNORMAL LOW (ref 4.0–10.5)

## 2014-03-04 LAB — POCT GLYCOSYLATED HEMOGLOBIN (HGB A1C): Hemoglobin A1C: 5.8

## 2014-03-04 LAB — GLUCOSE, CAPILLARY: GLUCOSE-CAPILLARY: 103 mg/dL — AB (ref 70–99)

## 2014-03-04 MED ORDER — INSULIN NPH (HUMAN) (ISOPHANE) 100 UNIT/ML ~~LOC~~ SUSP: 12.0000 [IU] | Freq: Two times a day (BID) | SUBCUTANEOUS | Status: DC

## 2014-03-04 NOTE — Assessment & Plan Note (Signed)
Assessment: Pt with platelet count of 136K on 11/17/13 who presents with no active bleeding or bruising.  Plan: -Obtain CBC

## 2014-03-04 NOTE — Patient Instructions (Addendum)
-  Start taking NPH 12 U twice a day instead of 15 U twice a day -Will check your labs today -Will see you back in 2 months,pleasure meeting you!   General Instructions:   Thank you for bringing your medicines today. This helps us keep you safe from mistakes.   Progress Toward Treatment Goals:  Treatment Goal 11/27/2013  Hemoglobin A1C unchanged    Self Care Goals & Plans:  Self Care Goal 03/04/2014  Manage my medications take my medicines as prescribed; bring my medications to every visit; refill my medications on time  Monitor my health keep track of my blood glucose; bring my glucose meter and log to each visit  Eat healthy foods drink diet soda or water instead of juice or soda; eat more vegetables; eat foods that are low in salt; eat baked foods instead of fried foods; eat fruit for snacks and desserts  Be physically active -  Meeting treatment goals -    Home Blood Glucose Monitoring 11/27/2013  Check my blood sugar 3 times a day  When to check my blood sugar before meals     Care Management & Community Referrals:  Referral 11/27/2013  Referrals made for care management support nutritionist  Referrals made to community resources none

## 2014-03-04 NOTE — Progress Notes (Signed)
Patient ID: Justin Drake, male   DOB: 1975/09/27, 38 y.o.   MRN: [id number]    Subjective:   Patient ID: Justin Drake male   DOB: April 15, 1976 38 y.o.   MRN: [id number]  HPI: Justin Drake is a 38 y.o. pleasant man with past medical history of newly diagnosed Type II diabetes mellitus who presents for routine follow-up visit.   His last A1c was 13.2 on 11/17/13. He reports compliance with taking NPH insulin 15 U BID and metformin XR 1000 mg daily. He checks his glucose twice a day and brings his glucose meter which reveals glucose range of 70-221 with average glucose of 96. He reports occasional symptomatic hypoglycemia. He also reports chronic blurry vision but denies polydipsia, polyuria, polyphagia, peripheral neuropathy, or foot ulcers/injury. He has not had a recent eye exam. He reports trying to follow a low-carbohydrate diet and stay active with stable weight.     Past Medical History  Diagnosis Date  . Asthma     when young  . Seasonal allergies   . Seasonal allergies   . Back pain, chronic    Current Outpatient Prescriptions  Medication Sig Dispense Refill  . bd getting started take home kit MISC 1 kit by Other route once.      . bd getting started take home kit MISC 1 kit by Other route once.      . Blood Gluc Meter Disp-Strips (BLOOD GLUCOSE METER DISPOSABLE) DEVI PLEASE USE TO CHECK YOUR BLOOD SUGARS THREE TIMES A DAY  100 each  1  . Blood Glucose Monitoring Suppl (BLOOD GLUCOSE METER) kit Use as instructed  1 each  1  . insulin NPH Human (NOVOLIN N RELION) 100 UNIT/ML injection Inject 0.15 mLs (15 Units total) into the skin 2 (two) times daily before a meal.  10 mL  3  . Insulin Syringe-Needle U-100 31G X 5/16" 0.3 ML MISC USE TO DRAW UP INSULIN, 12 UNITS NPH BID      . Lancets MISC PLEASE USE FOR CHECKING BLOOD SUGAR THREE TIMES A DAY  100 each  1  . metformin (FORTAMET) 1000 MG (OSM) 24 hr tablet Take 1 tablet (1,000 mg total) by mouth daily with  breakfast.  30 tablet  3   No current facility-administered medications for this visit.   Family History  Problem Relation Age of Onset  . Diabetes Mother   . Renal Disease Mother   . Hypertension Mother   . Kidney disease Mother     on kidney transplant list   . Stroke      great grand mother  . Cancer      great uncle  . Migraines Father   . Diabetes Maternal Aunt   . Kidney disease Maternal Aunt   . Diabetes Maternal Grandmother   . Diabetes Maternal Grandfather   . Kidney disease Maternal Grandfather   . Diabetes Maternal Aunt    History   Social History  . Marital Status: Married    Spouse Name: N/A    Number of Children: N/A  . Years of Education: N/A   Occupational History  . Art gallery manager    Social History Main Topics  . Smoking status: Never Smoker   . Smokeless tobacco: Never Used  . Alcohol Use: No  . Drug Use: No  . Sexual Activity: Yes   Other Topics Concern  . None   Social History Chief Financial Officer   Review of Systems: Review of Systems  Constitutional: Negative  for fever, chills and weight loss.  Eyes: Positive for blurred vision (chronic).  Respiratory: Negative for cough and shortness of breath.   Cardiovascular: Positive for leg swelling (occasionally). Negative for chest pain.  Gastrointestinal: Negative for nausea, vomiting, abdominal pain, diarrhea and constipation.  Genitourinary: Negative for dysuria, urgency, frequency and hematuria.  Neurological: Negative for dizziness, sensory change and headaches.  Endo/Heme/Allergies: Positive for environmental allergies. Negative for polydipsia.    Objective:  Physical Exam: Filed Vitals:   03/04/14 1618  BP: 112/75  Pulse: 70  Temp: 98.4 F (36.9 C)  TempSrc: Oral  Height: 5' 9.9" (1.775 m)  Weight: 236 lb 4.8 oz (107.185 kg)  SpO2: 99%    Physical Exam  Constitutional: He is oriented to person, place, and time. He appears well-developed and well-nourished. No distress.  HENT:    Head: Normocephalic and atraumatic.  Eyes: EOM are normal.  Neck: Normal range of motion. Neck supple.  Cardiovascular: Normal rate and regular rhythm.   Pulmonary/Chest: Effort normal and breath sounds normal. No respiratory distress. He has no wheezes. He has no rales.  Abdominal: Soft. Bowel sounds are normal. He exhibits no distension. There is no tenderness. There is no rebound and no guarding.  Musculoskeletal: Normal range of motion. He exhibits no edema and no tenderness.  Neurological: He is alert and oriented to person, place, and time.  Skin: Skin is warm and dry. No rash noted. He is not diaphoretic. No erythema. No pallor.  Psychiatric: He has a normal mood and affect. His behavior is normal. Judgment and thought content normal.    Assessment & Plan:   Please see problem list for problem-based assessment and plan

## 2014-03-04 NOTE — Assessment & Plan Note (Addendum)
Assessment: Pt with last A1c of 13.2 on 11/17/13 compliant with oral hypoglycemic and insulin therapy with occasional symptomatic hypoglycemia who presents with CBG of 103 and significantly improved A1c of 5.8.  Plan:  -A1c 5.8 at goal <7, decrease insulin NPH from 15 U BID to 12 U BID due to symptomatic hypoglycemia and continue metformin XR 1000 mg daily -BP 112/75 at goal <140/90 not on anti-hypertensive therapy -Obtain annual lipid panel -Obtain annual urine microalbumin test -Pt to have annual eye exam soon (reported will go to [facility]) -Perform annual foot exam -Obtain BMP  -BMI 34.02 not at goal <25, encourage weight loss

## 2014-03-04 NOTE — Assessment & Plan Note (Signed)
Pt declined tdap vaccination at this time but agreeable at next visit.

## 2014-03-05 LAB — MICROALBUMIN / CREATININE URINE RATIO
CREATININE, URINE: 265 mg/dL
MICROALB/CREAT RATIO: 1.9 mg/g (ref 0.0–30.0)
Microalb, Ur: 0.5 mg/dL (ref 0.00–1.89)

## 2014-03-05 MED ORDER — INSULIN NPH (HUMAN) (ISOPHANE) 100 UNIT/ML ~~LOC~~ SUSP: 12.0000 [IU] | Freq: Two times a day (BID) | SUBCUTANEOUS | Status: DC

## 2014-03-05 NOTE — Addendum Note (Signed)
Addended byOtis Brace: Karyme Mcconathy on: 03/05/2014 10:44 AM   Modules accepted: Orders

## 2014-03-05 NOTE — Progress Notes (Signed)
Case discussed with Dr. Johna Rolesabbani at the time of the visit.  We reviewed the resident's history and exam and pertinent patient test results.  I agree with the assessment, diagnosis, and plan of care documented in the resident's note. Agree with decrease in insulin even though no objective hypoglycemia documented.

## 2014-06-19 ENCOUNTER — Other Ambulatory Visit: Payer: Self-pay | Admitting: *Deleted

## 2014-06-19 MED ORDER — METFORMIN HCL ER (OSM) 1000 MG PO TB24: 1000.0000 mg | ORAL_TABLET | Freq: Every day | ORAL | Status: DC

## 2014-06-20 ENCOUNTER — Other Ambulatory Visit: Payer: Self-pay | Admitting: *Deleted

## 2014-06-20 MED ORDER — METFORMIN HCL ER 500 MG PO TB24: 1000.0000 mg | ORAL_TABLET | Freq: Every day | ORAL | Status: DC

## 2014-06-20 NOTE — Telephone Encounter (Signed)
Please change to Metformin 500 mg, less expensive.

## 2014-06-24 ENCOUNTER — Telehealth: Payer: Self-pay | Admitting: *Deleted

## 2014-06-24 NOTE — Telephone Encounter (Signed)
Called [facility] [place], clarified which metformin pt was to get, called pt informed him that he should be able to pick up metformin this evening

## 2014-08-11 ENCOUNTER — Ambulatory Visit: Payer: Self-pay | Admitting: Internal Medicine

## 2014-08-13 ENCOUNTER — Ambulatory Visit: Payer: Self-pay | Admitting: Internal Medicine

## 2014-08-13 ENCOUNTER — Other Ambulatory Visit: Payer: Self-pay | Admitting: *Deleted

## 2014-08-13 DIAGNOSIS — E119 Type 2 diabetes mellitus without complications: Secondary | ICD-10-CM

## 2014-08-14 ENCOUNTER — Ambulatory Visit: Payer: Self-pay | Admitting: Internal Medicine

## 2014-08-15 MED ORDER — INSULIN NPH (HUMAN) (ISOPHANE) 100 UNIT/ML ~~LOC~~ SUSP: 12.0000 [IU] | Freq: Two times a day (BID) | SUBCUTANEOUS | Status: DC

## 2014-08-19 ENCOUNTER — Ambulatory Visit: Payer: Self-pay | Admitting: Internal Medicine

## 2014-08-20 ENCOUNTER — Encounter: Payer: Self-pay | Admitting: Internal Medicine

## 2014-08-20 ENCOUNTER — Ambulatory Visit (INDEPENDENT_AMBULATORY_CARE_PROVIDER_SITE_OTHER): Payer: Self-pay | Admitting: Internal Medicine

## 2014-08-20 VITALS — BP 126/70 | HR 79 | Temp 97.7°F | Wt 243.6 lb

## 2014-08-20 DIAGNOSIS — Z23 Encounter for immunization: Secondary | ICD-10-CM

## 2014-08-20 DIAGNOSIS — E119 Type 2 diabetes mellitus without complications: Secondary | ICD-10-CM

## 2014-08-20 DIAGNOSIS — Z0000 Encounter for general adult medical examination without abnormal findings: Secondary | ICD-10-CM

## 2014-08-20 DIAGNOSIS — Z794 Long term (current) use of insulin: Secondary | ICD-10-CM

## 2014-08-20 LAB — GLUCOSE, CAPILLARY: GLUCOSE-CAPILLARY: 104 mg/dL — AB (ref 70–99)

## 2014-08-20 LAB — POCT GLYCOSYLATED HEMOGLOBIN (HGB A1C): Hemoglobin A1C: 5.9

## 2014-08-20 MED ORDER — INSULIN NPH (HUMAN) (ISOPHANE) 100 UNIT/ML ~~LOC~~ SUSP: 10.0000 [IU] | Freq: Two times a day (BID) | SUBCUTANEOUS | Status: DC

## 2014-08-20 MED ORDER — METFORMIN HCL ER 500 MG PO TB24: 1000.0000 mg | ORAL_TABLET | Freq: Every day | ORAL | Status: DC

## 2014-08-20 NOTE — Progress Notes (Signed)
Internal Medicine Clinic Attending  Case discussed with Dr. Moding at the time of the visit.  We reviewed the resident's history and exam and pertinent patient test results.  I agree with the assessment, diagnosis, and plan of care documented in the resident's note. 

## 2014-08-20 NOTE — Assessment & Plan Note (Signed)
Lab Results  Component Value Date   HGBA1C 5.9 08/20/2014   HGBA1C 5.8 03/04/2014   HGBA1C 13.2* 11/17/2013     Assessment: Diabetes control: good control (HgbA1C at goal) Progress toward A1C goal:  at goal Comments: A1C still a little low. No readings below 70, but has been symptomatic.  Plan: Medications:  Continue metformin XR 1000 mg daily, decrease Novolin to 12 units BID. Home glucose monitoring: Frequency: 2 times a day Timing: before breakfast, at bedtime Instruction/counseling given: reminded to get eye exam, reminded to bring blood glucose meter & log to each visit, reminded to bring medications to each visit, discussed foot care, discussed the need for weight loss and discussed diet Educational resources provided:   Self management tools provided: copy of home glucose meter download, instructions for home glucose monitoring Other plans: Return to clinic in 3 months.

## 2014-08-20 NOTE — Progress Notes (Signed)
   Subjective:    Patient ID: Justin Drake, male    DOB: 1975-11-04, 39 y.o.   MRN: [id number]  HPI Justin Soberntoine M Youkhana is a 39 year old man with history of DM2 presenting for routine follow-up.  He was diagnosed with diabetes earlier this year with A1C of 13.2 on 11/17/13.  His A1C had improved to 5.8 on 03/04/14 after starting insulin and metformin, and his insulin was reduced due to some episodes of hypoglycemia.  He reports doing well, but putting on some extra weight since his last visit.  He says he is trying to get back on track.  He has been checking his blood sugars twice a day, and he says they run from 90-105.  He denies readings less than 70, but had some measurements of 70-72.  He feels week and has some trembling.  He will generally have a granola bar that will bring up his blood sugar.  He says that he ate some more carbs over the holidays, but he has been eating more salads and baked chicken recently.  He saw an eye doctor in November.  Review of Systems  Constitutional: Negative for fever, chills and fatigue.  HENT: Negative for congestion, rhinorrhea and sore throat.   Eyes: Negative for visual disturbance.  Respiratory: Negative for cough, shortness of breath and wheezing.   Cardiovascular: Negative for chest pain.  Gastrointestinal: Negative for nausea, vomiting, abdominal pain, diarrhea and constipation.  Genitourinary: Negative for dysuria.  Musculoskeletal: Positive for arthralgias (Occasional knee pain from previous injury.  Takes ibuprofen.). Negative for myalgias, back pain and joint swelling.  Skin: Negative for rash.  Neurological: Negative for dizziness, weakness, light-headedness and numbness.       Objective:   Physical Exam  Constitutional: He is oriented to person, place, and time. He appears well-developed and well-nourished. No distress.  Eyes: Conjunctivae and EOM are normal. Pupils are equal, round, and reactive to light. No scleral icterus.    Cardiovascular: Normal rate, regular rhythm, normal heart sounds and intact distal pulses.   Pulmonary/Chest: Effort normal and breath sounds normal. No respiratory distress. He has no wheezes.  Abdominal: Soft. Bowel sounds are normal. He exhibits no distension. There is no tenderness.  Musculoskeletal: Normal range of motion. He exhibits no edema or tenderness.  Neurological: He is alert and oriented to person, place, and time. No cranial nerve deficit. He exhibits normal muscle tone.  Skin: Skin is warm and dry. No rash noted. No erythema.          Assessment & Plan:  Please see problem-based assessment and plan.

## 2014-08-20 NOTE — Assessment & Plan Note (Signed)
ASCVD 5.4% and less than 40.  No indication for statin currently. -Flu shot and Tdap administered.

## 2014-08-20 NOTE — Patient Instructions (Signed)
Thank you for coming to clinic today Justin Drake.  General instructions: -Congratulations on your excellent blood sugar control. -I would like you to decrease your Novolin to 10 units twice a day to prevent low blood sugars. -In addition, make sure you have regular meals to prevent lows. -Keep trying to eat healthy and lose weight. -Please make a follow up appointment to return to clinic in 3 months.  Please bring your medicines with you each time you come.   Medicines may be  Eye drops  Herbal   Vitamins  Pills  Seeing these help us take care of you.

## 2014-11-06 ENCOUNTER — Encounter: Payer: Self-pay | Admitting: Internal Medicine

## 2014-11-06 ENCOUNTER — Ambulatory Visit (INDEPENDENT_AMBULATORY_CARE_PROVIDER_SITE_OTHER): Payer: Self-pay | Admitting: Internal Medicine

## 2014-11-06 VITALS — BP 113/81 | HR 70 | Temp 98.0°F | Ht 69.5 in | Wt 248.0 lb

## 2014-11-06 DIAGNOSIS — M722 Plantar fascial fibromatosis: Secondary | ICD-10-CM

## 2014-11-06 DIAGNOSIS — Z794 Long term (current) use of insulin: Secondary | ICD-10-CM

## 2014-11-06 DIAGNOSIS — E119 Type 2 diabetes mellitus without complications: Secondary | ICD-10-CM

## 2014-11-06 LAB — GLUCOSE, CAPILLARY: Glucose-Capillary: 90 mg/dL (ref 70–99)

## 2014-11-06 NOTE — Progress Notes (Signed)
Internal Medicine Clinic Attending  Case discussed with Dr. Hoffman at the time of the visit.  We reviewed the resident's history and exam and pertinent patient test results.  I agree with the assessment, diagnosis, and plan of care documented in the resident's note.  

## 2014-11-06 NOTE — Assessment & Plan Note (Signed)
We discussed exercise, OTC braces and sleeves, he has already picked up a shoe insert which he will continue to use. - Recommend OTC Ibuprofen for 2-3 weeks for temporary relief.

## 2014-11-06 NOTE — Progress Notes (Signed)
[facility] Subjective:   Patient ID: Justin Drake male   DOB: [birth year] 39 y.o.   MRN: [id number]  HPI: [name] is a 39 y.o. male with a PMH detailed below who presents for acute right foot pain.  He report that his has had 1 month of right foot pain that is typically worse in morning and gets better throughout the day but can be aggravating in standing for long periods (he works as a Art gallery manager).  He has tried some ibuprofen with only minimal relief.  He notes the pain is located at the bottom of his foot and most acutely at the heel.  He did recently buy some expensive shoe inserts that were customized for his foot but has only gotten mild relief.  He is a well controlled diabetic, he is taking metformin 571m XR 2 pills daily with 10 units of NPH BID.  He reports sugars of 80-120 and download of his meter confirms this.  He deneis any hypoglycemia.  Past Medical History  Diagnosis Date  . Asthma     when young  . Seasonal allergies   . Seasonal allergies   . Back pain, chronic   . DM2 (diabetes mellitus, type 2) 11/18/2013   Current Outpatient Prescriptions  Medication Sig Dispense Refill  . Blood Gluc Meter Disp-Strips (BLOOD GLUCOSE METER DISPOSABLE) DEVI PLEASE USE TO CHECK YOUR BLOOD SUGARS THREE TIMES A DAY 100 each 1  . Blood Glucose Monitoring Suppl (BLOOD GLUCOSE METER) kit Use as instructed 1 each 1  . insulin NPH Human (NOVOLIN N RELION) 100 UNIT/ML injection Inject 0.1 mLs (10 Units total) into the skin 2 (two) times daily before a meal. 30 mL 3  . Insulin Syringe-Needle U-100 31G X 5/16" 0.3 ML MISC USE TO DRAW UP INSULIN, 12 UNITS NPH BID    . Lancets MISC PLEASE USE FOR CHECKING BLOOD SUGAR THREE TIMES A DAY 100 each 1  . metFORMIN (GLUCOPHAGE-XR) 500 MG 24 hr tablet Take 2 tablets (1,000 mg total) by mouth daily with breakfast. 60 tablet 5   No current facility-administered medications for this visit.   Family History   Problem Relation Age of Onset  . Diabetes Mother   . Renal Disease Mother   . Hypertension Mother   . Kidney disease Mother     on kidney transplant list   . Stroke      great grand mother  . Cancer      great uncle  . Migraines Father   . Diabetes Maternal Aunt   . Kidney disease Maternal Aunt   . Diabetes Maternal Grandmother   . Diabetes Maternal Grandfather   . Kidney disease Maternal Grandfather   . Diabetes Maternal Aunt    History   Social History  . Marital Status: Married    Spouse Name: N/A  . Number of Children: N/A  . Years of Education: N/A   Occupational History  . bArt gallery manager   Social History Main Topics  . Smoking status: Never Smoker   . Smokeless tobacco: Never Used  . Alcohol Use: No  . Drug Use: No  . Sexual Activity: Yes   Other Topics Concern  . None   Social History NChief Financial Officer  Review of Systems: Review of Systems  Constitutional: Negative for fever.  Eyes: Negative for blurred vision.  Respiratory: Negative for cough and shortness of breath.   Gastrointestinal: Negative for nausea, vomiting, abdominal pain and diarrhea.  Genitourinary: Negative for dysuria.  Musculoskeletal: Negative for myalgias.  Neurological: Negative for headaches.  Endo/Heme/Allergies: Negative for polydipsia.     Objective:  Physical Exam: Filed Vitals:   11/06/14 1040  BP: 113/81  Pulse: 70  Temp: 98 F (36.7 C)  TempSrc: Oral  Height: 5' 9.5" (1.765 m)  Weight: 248 lb (112.492 kg)  SpO2: 99%   Physical Exam  Constitutional: He is well-developed, well-nourished, and in no distress.  HENT:  Head: Normocephalic and atraumatic.  Eyes: Conjunctivae are normal.  Cardiovascular: Normal rate and regular rhythm.   Pulmonary/Chest: Effort normal and breath sounds normal. He has no wheezes.  Abdominal: Soft. Bowel sounds are normal.  Musculoskeletal:  Right foot without gross deformity or ulceration, has acute tenderness at insertion of plantar  fascia to heel.  Normal passive and active ROM of ankle.  Nursing note and vitals reviewed.    Assessment & Plan:  Case discussed with Dr. Ellwood Dense  Plantar fasciitis of right foot We discussed exercise, OTC braces and sleeves, he has already picked up a shoe insert which he will continue to use. - Recommend OTC Ibuprofen for 2-3 weeks for temporary relief.   DM2 (diabetes mellitus, type 2) Lab Results  Component Value Date   HGBA1C 5.9 08/20/2014   HGBA1C 5.8 03/04/2014   HGBA1C 13.2* 11/17/2013     Assessment: Diabetes control: good control (HgbA1C at goal) Progress toward A1C goal:  at goal Comments: CBGs from meter very well controlled  Plan: Medications:  Metformin 1069m daily, Novolin n 10u BID Home glucose monitoring: Frequency: 2 times a day Timing: before breakfast, at bedtime Instruction/counseling given: reminded to get eye exam, reminded to bring blood glucose meter & log to each visit, reminded to bring medications to each visit and discussed foot care Educational resources provided:   Self management tools provided: copy of home glucose meter download Other plans: He is doing very well, would recommend checking A1c every 6 months.  Patient may delay next follow up visit due to his control to 3-6 months from now.         Medications Ordered No orders of the defined types were placed in this encounter.   Other Orders Orders Placed This Encounter  Procedures  . Glucose, capillary

## 2014-11-06 NOTE — Patient Instructions (Signed)
General Instructions: Please follow up as needed otherwise we can see you in 6 months  Work on the exercises we talked about.  You can take OTC Ibuprofen for pain relief.  You can take a total of 12 pills a day.  Thank you for bringing your medicines today. This helps [place] keep you safe from mistakes.   Progress Toward Treatment Goals:  Treatment Goal 08/20/2014  Hemoglobin A1C at goal    Self Care Goals & Plans:  Self Care Goal 11/06/2014  Manage my medications take my medicines as prescribed; bring my medications to every visit  Monitor my health bring my glucose meter and log to each visit; keep track of my blood glucose  Eat healthy foods drink diet soda or water instead of juice or soda  Be physically active find an activity I enjoy; take a walk every day  Meeting treatment goals -    Home Blood Glucose Monitoring 08/20/2014  Check my blood sugar 2 times a day  When to check my blood sugar before breakfast; at bedtime     Care Management & Community Referrals:  Referral 11/27/2013  Referrals made for care management support nutritionist  Referrals made to community resources none     Plantar Fasciitis Plantar fasciitis is a common condition that causes foot pain. It is soreness (inflammation) of the band of tough fibrous tissue on the bottom of the foot that runs from the heel bone (calcaneus) to the ball of the foot. The cause of this soreness may be from excessive standing, poor fitting shoes, running on hard surfaces, being overweight, having an abnormal walk, or overuse (this is common in runners) of the painful foot or feet. It is also common in aerobic exercise dancers and ballet dancers. SYMPTOMS  Most people with plantar fasciitis complain of:  Severe pain in the morning on the bottom of their foot especially when taking the first steps out of bed. This pain recedes after a few minutes of walking.  Severe pain is experienced also during walking following a long  period of inactivity.  Pain is worse when walking barefoot or up stairs DIAGNOSIS   Your caregiver will diagnose this condition by examining and feeling your foot.  Special tests such as X-rays of your foot, are usually not needed. PREVENTION   Consult a sports medicine professional before beginning a new exercise program.  Walking programs offer a good workout. With walking there is a lower chance of overuse injuries common to runners. There is less impact and less jarring of the joints.  Begin all new exercise programs slowly. If problems or pain develop, decrease the amount of time or distance until you are at a comfortable level.  Wear good shoes and replace them regularly.  Stretch your foot and the heel cords at the back of the ankle (Achilles tendon) both before and after exercise.  Run or exercise on even surfaces that are not hard. For example, asphalt is better than pavement.  Do not run barefoot on hard surfaces.  If using a treadmill, vary the incline.  Do not continue to workout if you have foot or joint problems. Seek professional help if they do not improve. HOME CARE INSTRUCTIONS   Avoid activities that cause you pain until you recover.  Use ice or cold packs on the problem or painful areas after working out.  Only take over-the-counter or prescription medicines for pain, discomfort, or fever as directed by your caregiver.  Soft shoe inserts or athletic  shoes with air or gel sole cushions may be helpful.  If problems continue or become more severe, consult a sports medicine caregiver or your own health care provider. Cortisone is a potent anti-inflammatory medication that may be injected into the painful area. You can discuss this treatment with your caregiver. MAKE SURE YOU:   Understand these instructions.  Will watch your condition.  Will get help right away if you are not doing well or get worse. Document Released: 04/12/2001 Document Revised:  10/10/2011 Document Reviewed: 06/11/2008 [facility] Patient Information 2015 [employer] [place]. This information is not intended to replace advice given to you by your health care provider. Make sure you discuss any questions you have with your health care provider.

## 2014-11-06 NOTE — Assessment & Plan Note (Addendum)
Lab Results  Component Value Date   HGBA1C 5.9 08/20/2014   HGBA1C 5.8 03/04/2014   HGBA1C 13.2* 11/17/2013     Assessment: Diabetes control: good control (HgbA1C at goal) Progress toward A1C goal:  at goal Comments: CBGs from meter very well controlled  Plan: Medications:  Metformin 1000mg  daily, Novolin n 10u BID Home glucose monitoring: Frequency: 2 times a day Timing: before breakfast, at bedtime Instruction/counseling given: reminded to get eye exam, reminded to bring blood glucose meter & log to each visit, reminded to bring medications to each visit and discussed foot care Educational resources provided:   Self management tools provided: copy of home glucose meter download Other plans: He is doing very well, would recommend checking A1c every 6 months.  Patient may delay next follow up visit due to his control to 3-6 months from now.

## 2014-11-27 ENCOUNTER — Encounter: Payer: Self-pay | Admitting: Pulmonary Disease

## 2015-04-02 ENCOUNTER — Encounter: Payer: Self-pay | Admitting: Pulmonary Disease

## 2015-05-14 ENCOUNTER — Encounter: Payer: Self-pay | Admitting: Pulmonary Disease

## 2015-05-14 ENCOUNTER — Ambulatory Visit (INDEPENDENT_AMBULATORY_CARE_PROVIDER_SITE_OTHER): Payer: Self-pay | Admitting: Pulmonary Disease

## 2015-05-14 ENCOUNTER — Ambulatory Visit (HOSPITAL_COMMUNITY)
Admission: RE | Admit: 2015-05-14 | Discharge: 2015-05-14 | Disposition: A | Discharge status: Home / Self Care | Payer: Self-pay | Source: Ambulatory Visit | Attending: Pulmonary Disease | Admitting: Pulmonary Disease

## 2015-05-14 VITALS — BP 132/81 | HR 84 | Temp 97.7°F | Ht 69.5 in | Wt 246.5 lb

## 2015-05-14 DIAGNOSIS — Z794 Long term (current) use of insulin: Secondary | ICD-10-CM

## 2015-05-14 DIAGNOSIS — R079 Chest pain, unspecified: Secondary | ICD-10-CM | POA: Insufficient documentation

## 2015-05-14 DIAGNOSIS — E119 Type 2 diabetes mellitus without complications: Secondary | ICD-10-CM

## 2015-05-14 DIAGNOSIS — Z7984 Long term (current) use of oral hypoglycemic drugs: Secondary | ICD-10-CM

## 2015-05-14 DIAGNOSIS — R0789 Other chest pain: Secondary | ICD-10-CM

## 2015-05-14 LAB — POCT GLYCOSYLATED HEMOGLOBIN (HGB A1C): HEMOGLOBIN A1C: 5.8

## 2015-05-14 LAB — GLUCOSE, CAPILLARY: Glucose-Capillary: 101 mg/dL — ABNORMAL HIGH (ref 65–99)

## 2015-05-14 NOTE — Patient Instructions (Addendum)
1. Increase your metformin to 3 tablets (1500mg  total) by mouth daily with breakfast. Decrease your insulin to 5 units twice daily before a meal. Please keep a log of your blood sugars.  2. After 7 days, you can increase your metformin to 4 tablets (2000mg  total) by mouth daily with breakfast. Please call the clinic for instructions on your insulin. Please have your blood sugar log ready.  Also please call the clinic if you continue to have the chest pains or they start to worsen. If you have the chest pain and it does not go away on its own, please go to the emergency department.  Nonspecific Chest Pain It is often hard to find the cause of chest pain. There is always a chance that your pain could be related to something serious, such as a heart attack or a blood clot in your lungs. Chest pain can also be caused by conditions that are not life-threatening. If you have chest pain, it is very important to follow up with your doctor.  HOME CARE  If you were prescribed an antibiotic medicine, finish it all even if you start to feel better.  Avoid any activities that cause chest pain.  Do not use any tobacco products, including cigarettes, chewing tobacco, or electronic cigarettes. If you need help quitting, ask your doctor.  Do not drink alcohol.  Take medicines only as told by your doctor.  Keep all follow-up visits as told by your doctor. This is important. This includes any further testing if your chest pain does not go away.  Your doctor may tell you to keep your head raised (elevated) while you sleep.  Make lifestyle changes as told by your doctor. These may include:  Getting regular exercise. Ask your doctor to suggest some activities that are safe for you.  Eating a heart-healthy diet. Your doctor or a diet specialist (dietitian) can help you to learn healthy eating options.  Maintaining a healthy weight.  Managing diabetes, if necessary.  Reducing stress. GET HELP IF:  Your  chest pain does not go away, even after treatment.  You have a rash with blisters on your chest.  You have a fever. GET HELP RIGHT AWAY IF:  Your chest pain is worse.  You have an increasing cough, or you cough up blood.  You have severe belly (abdominal) pain.  You feel extremely weak.  You pass out (faint).  You have chills.  You have sudden, unexplained chest discomfort.  You have sudden, unexplained discomfort in your arms, back, neck, or jaw.  You have shortness of breath at any time.  You suddenly start to sweat, or your skin gets clammy.  You feel nauseous.  You vomit.  You suddenly feel light-headed or dizzy.  Your heart begins to beat quickly, or it feels like it is skipping beats. These symptoms may be an emergency. Do not wait to see if the symptoms will go away. Get medical help right away. Call your local emergency services (911 in the [place] Do not drive yourself to the hospital.   This information is not intended to replace advice given to you by your health care provider. Make sure you discuss any questions you have with your health care provider.   Document Released: 01/04/2008 Document Revised: 08/08/2014 Document Reviewed: 02/21/2014 Elsevier Interactive Patient Education [employer]2016 Elsevier Inc.

## 2015-05-14 NOTE — Progress Notes (Signed)
   Subjective:   Patient ID: Justin Drake, male    DOB: [birth year], 39 y.o.   MRN: [id number]  HPI [name] is a 39 year old man with history of childhood asthma, seasonal allergies, DM2 presenting for follow up.  He has no complaints today.  DM: he reports no problems with metformin or insulin NPH. He does occasionally get episodes of symptomatic hypoglycemia to 70s when he misses a meal. This resolves with PO intake.  Review of Systems Constitutional: no fevers/chills Eyes: no vision changes Ears, nose, mouth, throat, and face: no cough Respiratory: no shortness of breath Cardiovascular: no chest pain Gastrointestinal: no nausea/vomiting, no abdominal pain, no constipation, no diarrhea Genitourinary: no dysuria, no hematuria Integument: no rash Hematologic/lymphatic: no bleeding/bruising, no edema Musculoskeletal: no arthralgias, no myalgias Neurological: no paresthesias, no weakness  Past Medical History  Diagnosis Date  . Asthma     when young  . Seasonal allergies   . Back pain, chronic   . DM2 (diabetes mellitus, type 2) 11/18/2013   Current Outpatient Prescriptions on File Prior to Visit  Medication Sig Dispense Refill  . Blood Gluc Meter Disp-Strips (BLOOD GLUCOSE METER DISPOSABLE) DEVI PLEASE USE TO CHECK YOUR BLOOD SUGARS THREE TIMES A DAY 100 each 1  . Blood Glucose Monitoring Suppl (BLOOD GLUCOSE METER) kit Use as instructed 1 each 1  . insulin NPH Human (NOVOLIN N RELION) 100 UNIT/ML injection Inject 0.1 mLs (10 Units total) into the skin 2 (two) times daily before a meal. 30 mL 3  . Insulin Syringe-Needle U-100 31G X 5/16" 0.3 ML MISC USE TO DRAW UP INSULIN, 12 UNITS NPH BID    . Lancets MISC PLEASE USE FOR CHECKING BLOOD SUGAR THREE TIMES A DAY 100 each 1  . metFORMIN (GLUCOPHAGE-XR) 500 MG 24 hr tablet Take 2 tablets (1,000 mg total) by mouth daily with breakfast. 60 tablet 5   No current facility-administered medications on file prior  to visit.    Today's Vitals   05/14/15 1601  BP: 132/81  Pulse: 84  Temp: 97.7 F (36.5 C)  TempSrc: Oral  Height: 5' 9.5" (1.765 m)  Weight: 246 lb 8 oz (111.812 kg)  SpO2: 100%  PainSc: 0-No pain    Objective:  Physical Exam  Constitutional: He is oriented to person, place, and time. He appears well-developed and well-nourished. No distress.  Eyes: Conjunctivae and EOM are normal. Pupils are equal, round, and reactive to light. No scleral icterus.  Cardiovascular: Normal rate, regular rhythm, normal heart sounds and intact distal pulses.   Pulses:      Dorsalis pedis pulses are 1+ on the right side, and 1+ on the left side.       Posterior tibial pulses are 1+ on the right side, and 1+ on the left side.  Pulmonary/Chest: Effort normal and breath sounds normal. No respiratory distress. He has no wheezes.  Abdominal: Soft. Bowel sounds are normal. He exhibits no distension. There is no tenderness.  Musculoskeletal: Normal range of motion. He exhibits no edema or tenderness.  Neurological: He is alert and oriented to person, place, and time. No cranial nerve deficit. He exhibits normal muscle tone.  Skin: Skin is warm and dry. No rash noted. No erythema.   Assessment & Plan:  Please refer to problem based charting.

## 2015-05-15 LAB — MICROALBUMIN / CREATININE URINE RATIO: Creatinine, Urine: 192 mg/dL

## 2015-05-15 MED ORDER — METFORMIN HCL ER 500 MG PO TB24: 2000.0000 mg | ORAL_TABLET | Freq: Every day | ORAL | Status: DC

## 2015-05-15 MED ORDER — INSULIN NPH (HUMAN) (ISOPHANE) 100 UNIT/ML ~~LOC~~ SUSP: 5.0000 [IU] | Freq: Two times a day (BID) | SUBCUTANEOUS | Status: DC

## 2015-05-15 NOTE — Assessment & Plan Note (Signed)
Chest pain located mid sternum that is dull to sharp in quality. Occurs every few weeks or so. Not associated with physical activity. Does get associated dyspnea. Lasts for a few minutes and resolves spontaneously. Last episode about a month ago.  EKG with NSR. Nonspecific T wave inversion in V1. No acute ischemic changes.  Plan: -Instructed patient to call clinic if his chest pain continues or worsens. Will consider further evaluation (exercise stress). -If chest pain recurs and does not resolve spontaneously, or if associated with N/V, diaphoresis, etc, patient instructed to head to ED.

## 2015-05-15 NOTE — Assessment & Plan Note (Signed)
Lab Results  Component Value Date   HGBA1C 5.8 05/14/2015   HGBA1C 5.9 08/20/2014   HGBA1C 5.8 03/04/2014     Assessment: Diabetes control: controlled Progress toward A1C goal:  At goal Comments: Symptomatic hypoglycemia every few weeks.  Plan: Medications:  Increase metformin XR 1000mg  daily to 1500mg  daily. After 1 week, increase to 2000mg  daily. Decrease insulin NPH to 5u BID. Other plans:  -Asked patient to call clinic in 1 week with log of blood glucose so that I can further titrate his insulin. -Follow up in 1 month

## 2015-05-18 NOTE — Progress Notes (Signed)
Internal Medicine Clinic Attending  Case discussed with Dr. Krall soon after the resident saw the patient.  We reviewed the resident's history and exam and pertinent patient test results.  I agree with the assessment, diagnosis, and plan of care documented in the resident's note. 

## 2015-06-01 ENCOUNTER — Telehealth: Payer: Self-pay | Admitting: Pulmonary Disease

## 2015-06-01 NOTE — Telephone Encounter (Signed)
DOCTOR HAD TALKED TO PATIENT ABOUT STOPPING INSULIN BECAUSE A1C WAS LOW, HAS QUESTIONS AND WOULD LIKE CALL BACK.

## 2015-06-01 NOTE — Telephone Encounter (Signed)
Pt states doing well. CBG in AM 105-120 and in PM CBG 88-110. Pt has questions what to do next. Stanton KidneyDebra Magdalena Skilton RN 06/01/15 4:40PM

## 2015-06-02 NOTE — Telephone Encounter (Signed)
Please ensure that he has increased his metformin to 2000mg  (4 tablets) daily. He may discontinue the insulin. He should still keep checking his blood sugars regularly and call the clinic if his blood sugars are over 200.  Griffin BasilJennifer Krall, MD  Internal Medicine Teaching Service PGY-2

## 2015-06-02 NOTE — Telephone Encounter (Signed)
Talked with pt and aware of Dr Isabella BowensKrall response. Will sch an appt 1 month from last clinic visit.

## 2015-07-12 ENCOUNTER — Other Ambulatory Visit: Payer: Self-pay | Admitting: Internal Medicine

## 2015-07-13 ENCOUNTER — Other Ambulatory Visit: Payer: Self-pay | Admitting: Pulmonary Disease

## 2015-07-13 ENCOUNTER — Telehealth: Payer: Self-pay | Admitting: Pulmonary Disease

## 2015-07-13 NOTE — Telephone Encounter (Signed)
Please call pt back regarding direction for metformin.

## 2015-07-13 NOTE — Telephone Encounter (Signed)
done

## 2015-07-13 NOTE — Telephone Encounter (Signed)
Pt calls and states he cannot take 4 metformin daily, he is taking 3 but with 4 he becomes weak, drained, jittery, he wants to keep his dose at 3 until he sees you jan 5. You may call him at [phone]

## 2015-07-13 NOTE — Telephone Encounter (Signed)
Pt requesting metformin to be filled @ [facility] on [address].

## 2015-07-13 NOTE — Telephone Encounter (Signed)
Spoke to patient on phone. He has not been able to check his blood sugars when he feels drained/jittery. Will ask Dr. Selena BattenKim if he can change his Metformin XR 2000mg  daily to 1000mg  BID. Also asked him to try to test his blood glucose when he becomes symptomatic.

## 2015-07-14 NOTE — Telephone Encounter (Signed)
Hi Dr. Isabella BowensKrall, He can go with the 1000 mg XR either 2 tablets daily (preferred if adherence concern) or 1 tablet BID (may have less chance of side effects if we space the dose).  I wonder if he is attributing this to metformin when it might be the insulin. Can I check with patient to see how he is using his insulin and possibly recommend dose reduction or d/c insulin for now? A1C has been in the 5s.    Thank you

## 2015-07-15 NOTE — Telephone Encounter (Signed)
We had kept him on 500mg  XR because of cost but if you could look into this, that would be great. He was supposed to have stopped the insulin. I would appreciate if you could touch base with him on both these things.   Thanks again, CHS IncJennifer Tiyanna Larcom

## 2015-07-23 NOTE — Addendum Note (Signed)
Addended by: Mliss FritzKIM, JENNIFER J on: 07/23/2015 03:23 PM   Modules accepted: Orders, Medications

## 2015-07-31 NOTE — Telephone Encounter (Signed)
Will keep him on 3 tablets of metformin 500mg  XR daily for now. Follow up as previously planned.

## 2015-08-06 ENCOUNTER — Ambulatory Visit (INDEPENDENT_AMBULATORY_CARE_PROVIDER_SITE_OTHER): Payer: Self-pay | Admitting: Pulmonary Disease

## 2015-08-06 ENCOUNTER — Encounter: Payer: Self-pay | Admitting: Pulmonary Disease

## 2015-08-06 VITALS — BP 126/80 | HR 70 | Temp 97.4°F | Ht 69.75 in | Wt 245.8 lb

## 2015-08-06 DIAGNOSIS — Z7984 Long term (current) use of oral hypoglycemic drugs: Secondary | ICD-10-CM

## 2015-08-06 DIAGNOSIS — E119 Type 2 diabetes mellitus without complications: Secondary | ICD-10-CM

## 2015-08-06 DIAGNOSIS — R079 Chest pain, unspecified: Secondary | ICD-10-CM

## 2015-08-06 LAB — POCT GLYCOSYLATED HEMOGLOBIN (HGB A1C): Hemoglobin A1C: 6.5

## 2015-08-06 LAB — GLUCOSE, CAPILLARY: Glucose-Capillary: 149 mg/dL — ABNORMAL HIGH (ref 65–99)

## 2015-08-06 MED ORDER — METFORMIN HCL ER 500 MG PO TB24: 1500.0000 mg | ORAL_TABLET | Freq: Every day | ORAL | Status: DC

## 2015-08-06 NOTE — Assessment & Plan Note (Signed)
Assessment: Denies further episodes.  Plan: No further workup at this point.

## 2015-08-06 NOTE — Assessment & Plan Note (Signed)
Lab Results  Component Value Date   HGBA1C 6.5 08/06/2015   HGBA1C 5.8 05/14/2015   HGBA1C 5.9 08/20/2014     Assessment: Diabetes control: good control (HgbA1C at goal) Progress toward A1C goal:  at goal Comments: Denies episodes of symptomatic hypoglycemia  Plan: Medications:  Continue metformin XR 1,500mg  daily Home glucose monitoring: Discontinue routine testing. May check as needed. Instruction/counseling given: reminded to get eye exam Educational resources provided: brochure, handout, video Self management tools provided: copy of home glucose meter download Other plans:  -Follow up in 6 months

## 2015-08-06 NOTE — Progress Notes (Signed)
Case discussed with Dr. Krall at the time of the visit. We reviewed the resident's history and exam and pertinent patient test results. I agree with the assessment, diagnosis, and plan of care documented in the resident's note. 

## 2015-08-06 NOTE — Patient Instructions (Signed)
General Instructions:   Please bring your medicines with you each time you come to clinic.  Medicines may include prescription medications, over-the-counter medications, herbal remedies, eye drops, vitamins, or other pills.   Progress Toward Treatment Goals:  Treatment Goal 08/06/2015  Hemoglobin A1C at goal    Self Care Goals & Plans:  Self Care Goal 08/06/2015  Manage my medications take my medicines as prescribed; bring my medications to every visit; refill my medications on time; follow the sick day instructions if I am sick  Monitor my health keep track of my blood glucose; bring my glucose meter and log to each visit; check my feet daily  Eat healthy foods eat more vegetables; eat fruit for snacks and desserts; eat foods that are low in salt; eat baked foods instead of fried foods; eat smaller portions  Be physically active find an activity I enjoy  Meeting treatment goals -    Home Blood Glucose Monitoring 11/06/2014  Check my blood sugar 2 times a day  When to check my blood sugar before breakfast; at bedtime

## 2015-08-06 NOTE — Progress Notes (Signed)
   Subjective:    Patient ID: Justin Drake, male    DOB: 02-17-1976, 40 y.o.   MRN: [id number]  HPI [name] is a 40 year old man with history of childhood asthma, seasonal allergies, DM2 presenting for follow up of diabetes.  Denies further episodes of chest pain. One episode of reflux - associated with heavier meal and possibly went to bed too soon after meal.   Review of Systems Constitutional: no fevers/chills Eyes: no vision changes Ears, nose, mouth, throat, and face: no cough Respiratory: no shortness of breath Cardiovascular: no chest pain Gastrointestinal: no nausea/vomiting, no abdominal pain, no constipation, no diarrhea Genitourinary: no dysuria, no hematuria Hematologic/lymphatic: no edema  Past Medical History  Diagnosis Date  . Asthma     when young  . Seasonal allergies   . Back pain, chronic   . DM2 (diabetes mellitus, type 2) ([place]) 11/18/2013    Current Outpatient Prescriptions on File Prior to Visit  Medication Sig Dispense Refill  . Blood Gluc Meter Disp-Strips (BLOOD GLUCOSE METER DISPOSABLE) DEVI PLEASE USE TO CHECK YOUR BLOOD SUGARS THREE TIMES A DAY 100 each 1  . Blood Glucose Monitoring Suppl (BLOOD GLUCOSE METER) kit Use as instructed 1 each 1  . Lancets MISC PLEASE USE FOR CHECKING BLOOD SUGAR THREE TIMES A DAY 100 each 1  . metFORMIN (GLUCOPHAGE-XR) 500 MG 24 hr tablet TAKE TWO TABLETS BY MOUTH ONCE DAILY WITH BREAKFAST 60 tablet 4   No current facility-administered medications on file prior to visit.    Today's Vitals   08/06/15 1326  BP: 126/80  Pulse: 70  Temp: 97.4 F (36.3 C)  TempSrc: Oral  Height: 5' 9.75" (1.772 m)  Weight: 245 lb 12.8 oz (111.494 kg)  SpO2: 100%   Objective:   Physical Exam  Constitutional: He is oriented to person, place, and time. He appears well-developed and well-nourished. No distress.  HENT:  Head: Normocephalic and atraumatic.  Eyes: Conjunctivae are normal. No scleral icterus.    Neck: Neck supple.  Cardiovascular: Normal rate, regular rhythm and normal heart sounds.   Pulmonary/Chest: Effort normal and breath sounds normal. No respiratory distress. He has no wheezes.  Musculoskeletal: Normal range of motion. He exhibits no edema or tenderness.  Neurological: He is alert and oriented to person, place, and time. No cranial nerve deficit.  Skin: Skin is warm and dry.  Psychiatric: He has a normal mood and affect.   Assessment & Plan:  Please refer to problem based charting.

## 2015-11-27 ENCOUNTER — Other Ambulatory Visit: Payer: Self-pay | Admitting: Pulmonary Disease

## 2015-12-01 ENCOUNTER — Telehealth: Payer: Self-pay | Admitting: Pulmonary Disease

## 2015-12-01 NOTE — Telephone Encounter (Signed)
Metformin Refill

## 2015-12-01 NOTE — Telephone Encounter (Signed)
Spoke with pharmacy and verified that prescription for Metformin was sent and ready for pick up. Notified patient [facility] [address] Verified with patient that this is preferred pharmacy.

## 2016-02-04 ENCOUNTER — Encounter: Payer: Self-pay | Admitting: Pulmonary Disease

## 2016-02-04 ENCOUNTER — Ambulatory Visit (INDEPENDENT_AMBULATORY_CARE_PROVIDER_SITE_OTHER): Payer: Self-pay | Admitting: Pulmonary Disease

## 2016-02-04 VITALS — BP 122/75 | HR 80 | Temp 98.3°F | Ht 69.0 in | Wt 247.6 lb

## 2016-02-04 DIAGNOSIS — R600 Localized edema: Secondary | ICD-10-CM

## 2016-02-04 DIAGNOSIS — Z7984 Long term (current) use of oral hypoglycemic drugs: Secondary | ICD-10-CM

## 2016-02-04 DIAGNOSIS — R5383 Other fatigue: Secondary | ICD-10-CM

## 2016-02-04 DIAGNOSIS — E119 Type 2 diabetes mellitus without complications: Secondary | ICD-10-CM

## 2016-02-04 DIAGNOSIS — E559 Vitamin D deficiency, unspecified: Secondary | ICD-10-CM

## 2016-02-04 LAB — GLUCOSE, CAPILLARY: GLUCOSE-CAPILLARY: 105 mg/dL — AB (ref 65–99)

## 2016-02-04 LAB — POCT GLYCOSYLATED HEMOGLOBIN (HGB A1C): HEMOGLOBIN A1C: 7

## 2016-02-04 NOTE — Patient Instructions (Signed)
Follow up in 4-6 weeks

## 2016-02-04 NOTE — Progress Notes (Signed)
   CC: Left leg swelling  HPI:  [name] is a 40 year old man with childhood asthma, seasonal allergies, DM2 presenting for follow up of diabetes.  Fatigue: For the past couple of weeks. Not getting much exercise. Is a Paediatric nursebarber. No new stressors. Does not feel depressed. Still taking metformin XR 1,500mg  daily. Sleep feels normal at night - goes to bed between 10pm and midnight. Wakes up at 6:30-7AM. Feels a little tired after waking up. Feet feels swollen in the morning with some pain in the morning. Dorsal aspect of bilateral feet have pain. Pain in feet started a couple of months ago. It is intermittent. Improves throughout the day. Feet can also hurt a lot after being on his feet all day as well. Reduced appetite. Rarely has cold intolerance.   Has occasional swelling of the left leg from the knee down to his ankle. This has been going on for years. A little worse over the last couple of months. Had an arthroscopy of his left knee April 1993. No fluid presently. No redness or erythema. No calf pain. No claudication.   Not taking any supplements.  Past Medical History  Diagnosis Date  . Asthma     when young  . Seasonal allergies   . Back pain, chronic   . DM2 (diabetes mellitus, type 2) ([facility]) 11/18/2013    Review of Systems:   Constitutional: no fevers/chills Respiratory: no shortness of breath Cardiovascular: no chest pain Gastrointestinal: no nausea/vomiting, no abdominal pain, no diarrhea, no hematochezia or melena  Physical Exam:  Filed Vitals:   02/04/16 1613  BP: 122/75  Pulse: 80  Temp: 98.3 F (36.8 C)  TempSrc: Oral  Height: 5\' 9"  (1.753 m)  Weight: 247 lb 9.6 oz (112.311 kg)  SpO2: 99%   General Apperance: NAD HEENT: Normocephalic, atraumatic, anicteric sclera Neck: Supple, trachea midline Lungs: Clear to auscultation bilaterally. No wheezes, rhonchi or rales. Breathing comfortably Heart: Regular rate and rhythm, no murmur/rub/gallop Abdomen:  Soft, nontender, nondistended, no rebound/guarding Extremities: Warm and well perfused, no edema. 1+ DP/PT on R, dopplerable but nonpalpable on L. Skin: No rashes or lesions Neurologic: Alert and interactive. No gross deficits.   Assessment & Plan:   See encounters tab for problem based medical decision making.   Patient discussed with Dr. Cyndie ChimeGranfortuna

## 2016-02-05 LAB — BMP8+ANION GAP
ANION GAP: 17 mmol/L (ref 10.0–18.0)
BUN/Creatinine Ratio: 15 (ref 9–20)
BUN: 13 mg/dL (ref 6–20)
CALCIUM: 9.4 mg/dL (ref 8.7–10.2)
CHLORIDE: 101 mmol/L (ref 96–106)
CO2: 23 mmol/L (ref 18–29)
Creatinine, Ser: 0.87 mg/dL (ref 0.76–1.27)
GFR calc Af Amer: 126 mL/min/{1.73_m2} (ref >59)
GFR, EST NON AFRICAN AMERICAN: 109 mL/min/{1.73_m2} (ref >59)
GLUCOSE: 92 mg/dL (ref 65–99)
POTASSIUM: 4.2 mmol/L (ref 3.5–5.2)
Sodium: 141 mmol/L (ref 134–144)

## 2016-02-05 LAB — TSH: TSH: 0.859 u[IU]/mL (ref 0.450–4.500)

## 2016-02-05 LAB — VITAMIN D 25 HYDROXY (VIT D DEFICIENCY, FRACTURES): VIT D 25 HYDROXY: 16.2 ng/mL — AB (ref 30.0–100.0)

## 2016-02-06 DIAGNOSIS — R5383 Other fatigue: Secondary | ICD-10-CM | POA: Insufficient documentation

## 2016-02-06 NOTE — Assessment & Plan Note (Addendum)
Assessment: Unlikely to be related to depression. Could be from physical inactivity. No problems with sleep at night.  Plan: -TSH -Vitamin D lvl given bone pains -Encourage exercise

## 2016-02-06 NOTE — Assessment & Plan Note (Signed)
Assessment: Remains controlled with A1c of 7.  Plan: Continue Metformin 1500 mg daily

## 2016-02-06 NOTE — Assessment & Plan Note (Addendum)
Assessment: Chronic LLE intermittent swelling. Low suspicion for DVT given no skin changes that wound indicate a chronic thrombus. Nonpalpable but dopplerable pulses on L PT and DP. Uncertain why he would have arterial insufficiency only affecting one side. Could have been related to previous knee surgery. He has a few risk factors for PVD.  Plan: -Obtain ABI -Check BMP

## 2016-02-08 DIAGNOSIS — E559 Vitamin D deficiency, unspecified: Secondary | ICD-10-CM | POA: Insufficient documentation

## 2016-02-08 MED ORDER — CHOLECALCIFEROL 25 MCG (1000 UT) PO TABS: 1000.0000 [IU] | ORAL_TABLET | Freq: Every day | ORAL | Status: DC

## 2016-02-08 NOTE — Addendum Note (Signed)
Addended by: Griffin BasilKRALL, JENNIFER T on: 02/08/2016 02:35 PM   Modules accepted: Orders

## 2016-02-08 NOTE — Assessment & Plan Note (Signed)
Vitamin D lvl 16.   Start 1000 units daily. Repeat level in three months.

## 2016-02-09 NOTE — Progress Notes (Signed)
Medicine attending: Medical history, presenting problems, physical findings, and medications, reviewed with resident physician Dr Jennifer Krall on the day of the patient visit and I concur with her evaluation and management plan. 

## 2016-02-11 ENCOUNTER — Telehealth: Payer: Self-pay

## 2016-02-11 NOTE — Telephone Encounter (Signed)
Called and discussed lab results. He will start vitamin D supplementation. Thanks!

## 2016-02-11 NOTE — Telephone Encounter (Signed)
Requesting lab result. 

## 2016-02-11 NOTE — Telephone Encounter (Signed)
Patient requesting lab results from labs run 02/04/2016 can you call to discus with him?

## 2016-03-07 ENCOUNTER — Telehealth: Payer: Self-pay

## 2016-03-07 NOTE — Telephone Encounter (Signed)
Called pt, he states that he has a feeling of anxiety in his shoulders and arms, he is ask if he wishes harm to himself or anyone else, he denies, is ask to caLL 911 OR [facility] ed if he has any of these feelings, otherwise will be seen wed in clinic

## 2016-03-07 NOTE — Telephone Encounter (Signed)
Needs to speak with a nurse regarding anxiety.

## 2016-03-08 NOTE — Telephone Encounter (Signed)
Agree with plan 

## 2016-03-09 ENCOUNTER — Ambulatory Visit (INDEPENDENT_AMBULATORY_CARE_PROVIDER_SITE_OTHER): Payer: Self-pay | Admitting: Internal Medicine

## 2016-03-09 ENCOUNTER — Encounter: Payer: Self-pay | Admitting: Internal Medicine

## 2016-03-09 DIAGNOSIS — F411 Generalized anxiety disorder: Secondary | ICD-10-CM | POA: Insufficient documentation

## 2016-03-09 LAB — GLUCOSE, CAPILLARY: GLUCOSE-CAPILLARY: 125 mg/dL — AB (ref 65–99)

## 2016-03-09 MED ORDER — CITALOPRAM HYDROBROMIDE 20 MG PO TABS: 20.0000 mg | ORAL_TABLET | Freq: Every day | ORAL | 1 refills | Status: DC

## 2016-03-09 NOTE — Assessment & Plan Note (Signed)
Patient says that he thinks he may have anxiety. It has been going on for about 2 weeks.  He denies depression. He says the episodes happen couple of times a day and when they happen it feels like all of his energy is drained from the body and he would just want to lie down. He feels it is similar to 'when his sugars drop' and he can feel it in his arms and legs.   He has had ongoing issue with fatigue. TSH was checked and was normal. Vit D was borderline low at 16.  He says he gets good sleep. He works as a Paediatric nursebarber, and has a busy job and he would get tired at the end of the day. He lives at home with partner and 2 kids. He denies smoking, alcohol use, illicit drug use.  He denies overt symptoms of sleep apnea and wakes up refreshed. He enjoys his normal activities.  His PHQ 9 score was 5. His GAD 7 score was 4. He feels that he might be having 'midlife crisis' and right now just does not feel like doing anything. Even though both scores are low, he possibly could have anxiety and may benefit from a trial of citalopram. His CBG in the clinic was 125.   -Start citalopram 20 mg daily -advised to take Vit D 1000 IU daily -follow up in 4-6 weeks to re-assess

## 2016-03-09 NOTE — Progress Notes (Signed)
Medicine attending: Medical history, presenting problems, physical findings, and medications, reviewed with resident physician Dr Parth Saraiya on the day of the patient visit and I concur with his evaluation and management plan. 

## 2016-03-09 NOTE — Patient Instructions (Addendum)
Thank you for your visit today  Please start taking Celexa daily. Please follow up in 4-6 weeks

## 2016-03-09 NOTE — Progress Notes (Signed)
   CC: anxiety 'all over body' HPI: [name] is a 40 y.o. man with PMH noted below here for anxiety for 2 weeks   Please see Problem List/A&P for the status of the patient's chronic medical problems   Past Medical History:  Diagnosis Date  . Asthma    when young  . Back pain, chronic   . DM2 (diabetes mellitus, type 2) ([facility]) 11/18/2013  . Seasonal allergies     Review of Systems: Denies fevers, chills. Denies fatigue Feels like his 'sugar is dropping' but it is not Denies n,v,abd pain, diarrhea Denies cough, SOB Has anxiety  Physical Exam: Vitals:   03/09/16 1331  BP: 125/77  Pulse: 75  Temp: 98.5 F (36.9 C)  TempSrc: Oral  SpO2: 99%  Weight: 249 lb 3.2 oz (113 kg)  Height: 5\' 9"  (1.753 m)    General: A&O, in NAD CV: RRR, normal s1, s2, no m/r/g Resp: equal and symmetric breath sounds, no wheezing or crackles  Abdomen: soft, nontender, nondistended, +BS Skin: warm, dry, intact Extremities: no bilateral LE edema    Assessment & Plan:   See encounters tab for problem based medical decision making. Patient discussed with Dr. Cyndie ChimeGranfortuna

## 2016-03-17 ENCOUNTER — Telehealth: Payer: Self-pay

## 2016-03-17 NOTE — Telephone Encounter (Signed)
Questions regarding med. Please call back.

## 2016-03-17 NOTE — Telephone Encounter (Signed)
"  That new medicine made me feel jittery. My anxiety seems worse. I think it messed my stomach up too." Patient stopped taking Celexa yesterday. Offered patient an appointment today, but he declined. Added to your continuity clinic 03/31/2016. Advised patient to call and schedule an appointment sooner for any other problems. Patient agreed.

## 2016-03-31 ENCOUNTER — Encounter: Payer: Self-pay | Admitting: Pulmonary Disease

## 2016-05-16 ENCOUNTER — Other Ambulatory Visit: Payer: Self-pay | Admitting: Pulmonary Disease

## 2016-05-16 NOTE — Telephone Encounter (Signed)
metFORMIN (GLUCOPHAGE-XR) 500 MG 24 hr tablet  Refill Request

## 2016-05-18 MED ORDER — METFORMIN HCL ER 500 MG PO TB24: 1500.0000 mg | ORAL_TABLET | Freq: Every day | ORAL | 3 refills | Status: DC

## 2016-05-25 ENCOUNTER — Encounter: Payer: Self-pay | Admitting: Pulmonary Disease

## 2016-07-08 ENCOUNTER — Other Ambulatory Visit: Payer: Self-pay | Admitting: Pulmonary Disease

## 2016-07-08 NOTE — Telephone Encounter (Signed)
There are refills on Metformin; called pt and instructed to call [facility] pharmacy for any refills. Stated he had not called [facility]; but he will.

## 2016-07-08 NOTE — Telephone Encounter (Signed)
Medication Refill: metFORMIN (GLUCOPHAGE-XR) 500 MG 24 hr tablet  ° °

## 2016-07-14 ENCOUNTER — Ambulatory Visit (INDEPENDENT_AMBULATORY_CARE_PROVIDER_SITE_OTHER): Payer: Self-pay | Admitting: Pulmonary Disease

## 2016-07-14 ENCOUNTER — Encounter: Payer: Self-pay | Admitting: Pulmonary Disease

## 2016-07-14 VITALS — BP 126/85 | HR 101 | Temp 98.5°F | Ht 69.0 in | Wt 247.3 lb

## 2016-07-14 DIAGNOSIS — Z0000 Encounter for general adult medical examination without abnormal findings: Secondary | ICD-10-CM

## 2016-07-14 DIAGNOSIS — E119 Type 2 diabetes mellitus without complications: Secondary | ICD-10-CM

## 2016-07-14 DIAGNOSIS — Z23 Encounter for immunization: Secondary | ICD-10-CM

## 2016-07-14 DIAGNOSIS — E559 Vitamin D deficiency, unspecified: Secondary | ICD-10-CM

## 2016-07-14 DIAGNOSIS — Z9114 Patient's other noncompliance with medication regimen: Secondary | ICD-10-CM

## 2016-07-14 DIAGNOSIS — F411 Generalized anxiety disorder: Secondary | ICD-10-CM

## 2016-07-14 DIAGNOSIS — R600 Localized edema: Secondary | ICD-10-CM

## 2016-07-14 DIAGNOSIS — Z7984 Long term (current) use of oral hypoglycemic drugs: Secondary | ICD-10-CM

## 2016-07-14 LAB — POCT GLYCOSYLATED HEMOGLOBIN (HGB A1C): HEMOGLOBIN A1C: 8.6

## 2016-07-14 LAB — GLUCOSE, CAPILLARY: Glucose-Capillary: 190 mg/dL — ABNORMAL HIGH (ref 65–99)

## 2016-07-14 NOTE — Patient Instructions (Signed)
Please take your metformin as prescribed Follow up in 3 months

## 2016-07-14 NOTE — Progress Notes (Signed)
   CC: Diabetes follow up  HPI:  Justin Drake is a [age] y.[place]. man with childhood asthma, seasonal allergies, DM2 presenting for follow up of diabetes.  Anxiety is better now. Did not have to be on medication.  He reports he has not been as compliant of his metformin.    Past Medical History:  Diagnosis Date  . Asthma    when young  . Back pain, chronic   . DM2 (diabetes mellitus, type 2) ([facility]) 11/18/2013  . Seasonal allergies     Review of Systems:   No chest pain  No dyspnea  Physical Exam:  Vitals:   07/14/16 1607  BP: 126/85  Pulse: (!) 101  Temp: 98.5 F (36.9 C)  TempSrc: Oral  SpO2: 99%  Weight: 247 lb 4.8 oz (112.2 kg)  Height: 5\' 9"  (1.753 m)   General Apperance: NAD HEENT: Normocephalic, atraumatic, anicteric sclera Neck: Supple, trachea midline Lungs: Clear to auscultation bilaterally. No wheezes, rhonchi or rales. Breathing comfortably Heart: Regular rate and rhythm, no murmur/rub/gallop Abdomen: Soft, nontender, nondistended, no rebound/guarding Extremities: Warm and well perfused, no edema, 2+ bilateral DP pulses Skin: No rashes or lesions Neurologic: Alert and interactive. No gross deficits.   Assessment & Plan:   See Encounters Tab for problem based charting.  Patient discussed with Dr. Cyndie ChimeGranfortuna

## 2016-07-14 NOTE — Assessment & Plan Note (Signed)
Improved

## 2016-07-15 LAB — MICROALBUMIN / CREATININE URINE RATIO
Creatinine, Urine: 287.7 mg/dL
MICROALB/CREAT RATIO: 2.8 mg/g{creat} (ref 0.0–30.0)
MICROALBUM., U, RANDOM: 8 ug/mL

## 2016-07-15 LAB — VITAMIN D 25 HYDROXY (VIT D DEFICIENCY, FRACTURES): VIT D 25 HYDROXY: 27 ng/mL — AB (ref 30.0–100.0)

## 2016-07-15 NOTE — Progress Notes (Signed)
Medicine attending: Medical history, presenting problems, physical findings, and medications, reviewed with resident physician Dr Jennifer Krall on the day of the patient visit and I concur with her evaluation and management plan. 

## 2016-07-15 NOTE — Assessment & Plan Note (Signed)
He reports this has improved greatly and has not required medication.

## 2016-07-15 NOTE — Assessment & Plan Note (Signed)
Assessment: Hgb A1c deteriorated to 8.6 from 7  Plan: Discussed with him compliance of metformin. Will continue metformin 1500mg  daily. Consider addition of glipizide if he remains uncontrolled at 3 month follow up. He will be making his eye exam appointment soon. Foot exam done today.

## 2016-07-15 NOTE — Assessment & Plan Note (Signed)
Vitamin D level improved. Continue Vitamin D 1000 units daily for maintenance.

## 2016-07-15 NOTE — Assessment & Plan Note (Signed)
Flu shot administered.

## 2016-09-20 ENCOUNTER — Ambulatory Visit (INDEPENDENT_AMBULATORY_CARE_PROVIDER_SITE_OTHER): Payer: Self-pay | Admitting: Pulmonary Disease

## 2016-09-20 ENCOUNTER — Encounter (INDEPENDENT_AMBULATORY_CARE_PROVIDER_SITE_OTHER): Payer: Self-pay

## 2016-09-20 VITALS — BP 124/82 | HR 102 | Temp 98.9°F | Wt 238.9 lb

## 2016-09-20 DIAGNOSIS — J3489 Other specified disorders of nose and nasal sinuses: Secondary | ICD-10-CM

## 2016-09-20 DIAGNOSIS — E119 Type 2 diabetes mellitus without complications: Secondary | ICD-10-CM

## 2016-09-20 DIAGNOSIS — R05 Cough: Secondary | ICD-10-CM

## 2016-09-20 DIAGNOSIS — R51 Headache: Secondary | ICD-10-CM

## 2016-09-20 DIAGNOSIS — E1165 Type 2 diabetes mellitus with hyperglycemia: Secondary | ICD-10-CM

## 2016-09-20 DIAGNOSIS — J029 Acute pharyngitis, unspecified: Secondary | ICD-10-CM

## 2016-09-20 DIAGNOSIS — J069 Acute upper respiratory infection, unspecified: Secondary | ICD-10-CM

## 2016-09-20 LAB — GLUCOSE, CAPILLARY: Glucose-Capillary: 356 mg/dL — ABNORMAL HIGH (ref 65–99)

## 2016-09-20 MED ORDER — BUDESONIDE 32 MCG/ACT NA SUSP: 2.0000 | Freq: Every day | NASAL | 0 refills | Status: DC

## 2016-09-20 NOTE — Patient Instructions (Addendum)
Use cough medicine of your choice Use steroid nasal spray as instructed. Alternatives are Nasonex and Flonase (or generic versions of those). Follow up in 1 months  BUFFERED ISOTONIC SALINE NASAL IRRIGATION  The Benefits:  1. When you irrigate, the isotonic saline (salt water) acts as a solvent and washes the mucus crusts and other debris from your nose.  2. This decongests and improves the airflow into your nose. The sinus passages begin to open.  3. Studies have also shown that a salt water and an alkaline (baking soda) irrigation solution improves nasal membrane cell function (mucociliary flow of mucus debris).  The Recipe:  1. Choose a 1-quart glass jar that is thoroughly cleansed.  2. Fill with sterile or distilled water, or you can boil water from the tap.  3. Add 1 to 2 heaping teaspoons of "pickling/canning/sea" salt (NOT table salt as it contains a large number of additives). This salt is available at the grocery store in the food canning section.  4. Add 1 teaspoon of Arm & Hammer Baking Soda (pure bicarbonate).  5. Mix ingredients together and store at room temperature. Discard after one week. If you find this solution too strong, you may decrease the amount of salt added to 1 to 1  teaspoons. With children it is often best to start with a milder solution and advance slowly. Irrigate with 240 ml (8 oz) twice daily.  The Instructions:  You should plan to irrigate your nose with buffered isotonic saline 2 times per day. Many people prefer to warm the solution slightly in the microwave - but be sure that the solution is NOT HOT. Stand over the sink (some do this in the shower) and squirt the solution into each side of your nose, keeping your mouth open. This allows you to spit the saltwater out of your mouth. It will not harm you if you swallow a little.  If you have been told to use a nasal steroid such as Flonase, Nasonex, or Nasacort, you should always use isortonic saline  solution first, then use your nasal steroid product. The nasal steroid is much more effective when sprayed onto clean nasal membranes and the steroid medicine will reach deeper into the nose.  Most people experience a little burning sensation the first few times they use a isotonic saline solution, but this usually goes away within a few days.    NASAL STEROID USE INSTRUCTIONS  Step 1. Prepare the nose. Blow the nose before administering the drug.  Step 2. Prime and activate the delivery device as recommended by the manufacturer.  Step 3. Position the head by tilting the head forward.  Step 4. Insert the tip of the applicator gently, avoiding contact with the septum.  Step 5. Aim the applicator tip about 45 from the floor of the nose and direct it at the outer corner of the eye on the same side to avoid traumatizing or spraying the septum.  Step 6. Close the other nostril gently with a finger.   Step 7. Sniff or inhale gently while delivering the drug.

## 2016-09-20 NOTE — Progress Notes (Signed)
   CC: cough  HPI:  Justin Drake is a 41 y.o. man with asthma, diabetes type 2 here with cough  Just ate 3 fruit cups prior to arriving here.   Saturday afternoon, started having a cough. Throat has been dry a couple of days prior. Headache. Clear rhinorrhea. Sore throat. Cough is productive but he has not looked at it. 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 His younger son got sick around the same time. Fever last night 100.8. No myalgias. No nausea or vomiting. A little post nasal drip. A little sinus pressure.   Taking Coricidin. Feels overall a little worse.   Past Medical History:  Diagnosis Date  . Asthma    when young  . Back pain, chronic   . DM2 (diabetes mellitus, type 2) ([facility]) 11/18/2013  . Seasonal allergies     Review of Systems:   No chest pain No dyspnea  Physical Exam:  Vitals:   09/20/16 1004  BP: 124/82  Pulse: (!) 102  Temp: 98.9 F (37.2 C)  TempSrc: Oral  SpO2: 99%  Weight: 238 lb 14.4 oz (108.4 kg)   General Apperance: NAD HEENT: Normocephalic, atraumatic, anicteric sclera Neck: Supple, trachea midline Lungs: Clear to auscultation bilaterally. No wheezes, rhonchi or rales. Breathing comfortably Heart: Regular rate and rhythm, no murmur/rub/gallop Abdomen: Soft, nontender, nondistended, no rebound/guarding Extremities: Warm and well perfused, no edema Skin: No rashes or lesions Neurologic: Alert and interactive. No gross deficits.  Assessment & Plan:   See Encounters Tab for problem based charting.  Patient discussed with Dr. Oswaldo DoneVincent

## 2016-09-21 DIAGNOSIS — J069 Acute upper respiratory infection, unspecified: Secondary | ICD-10-CM | POA: Insufficient documentation

## 2016-09-21 NOTE — Assessment & Plan Note (Signed)
Assessment: 5 days of cough, rhinorrhea, sore throat. No myalgias. Probably viral upper respiratory infection  Plan: Continue supportive care with over-the-counter cough syrup Budesonide nasal spray 2 sprays daily Saline nasal rinses Return precautions discussed

## 2016-09-21 NOTE — Assessment & Plan Note (Signed)
Elevated random blood glucose. He had just eaten 3 fruit cups prior to arrival. Reminded him to continue working on his diet and medication compliance. Follow-up in 2-3 weeks

## 2016-09-22 NOTE — Addendum Note (Signed)
Addended by: Erlinda HongVINCENT, DUNCAN T on: 09/22/2016 02:29 PM   Modules accepted: Level of Service

## 2016-09-22 NOTE — Progress Notes (Signed)
Internal Medicine Clinic Attending  Case discussed with Dr. Krall at the time of the visit.  We reviewed the resident's history and exam and pertinent patient test results.  I agree with the assessment, diagnosis, and plan of care documented in the resident's note.  

## 2016-09-23 ENCOUNTER — Telehealth: Payer: Self-pay

## 2016-09-23 NOTE — Telephone Encounter (Signed)
Called and spoke to patient. He has been coughing, no exudates in throat. Recommended Cepacol or Chloraseptic spray. If no better or worse by Monday, will come in and see us. Thanks!

## 2016-09-23 NOTE — Telephone Encounter (Signed)
Pt calls and states that his ears and throat hurt when he swallows, states pain at times is at 9/10, does not know if he has fevers, today was his first day back at work, he is very concerned about the pain in his ears and throat, does not thinks he is having any drainage in his throat. Please advise

## 2016-09-23 NOTE — Telephone Encounter (Signed)
Needs to speak with a nurse about ear pain. Please call back.

## 2016-09-26 ENCOUNTER — Ambulatory Visit: Payer: Self-pay

## 2016-09-27 ENCOUNTER — Encounter: Payer: Self-pay | Admitting: Pulmonary Disease

## 2016-09-27 ENCOUNTER — Ambulatory Visit (INDEPENDENT_AMBULATORY_CARE_PROVIDER_SITE_OTHER): Payer: Self-pay | Admitting: Pulmonary Disease

## 2016-09-27 DIAGNOSIS — E119 Type 2 diabetes mellitus without complications: Secondary | ICD-10-CM

## 2016-09-27 DIAGNOSIS — Z7984 Long term (current) use of oral hypoglycemic drugs: Secondary | ICD-10-CM

## 2016-09-27 DIAGNOSIS — E1165 Type 2 diabetes mellitus with hyperglycemia: Secondary | ICD-10-CM

## 2016-09-27 MED ORDER — GLIPIZIDE 5 MG PO TABS: 5.0000 mg | ORAL_TABLET | Freq: Every day | ORAL | 1 refills | Status: DC

## 2016-09-27 MED ORDER — METFORMIN HCL ER 500 MG PO TB24: 1000.0000 mg | ORAL_TABLET | Freq: Two times a day (BID) | ORAL | 3 refills | Status: DC

## 2016-09-27 NOTE — Patient Instructions (Addendum)
Take METFORMIN TWO tablets TWICE A DAY WITH MEALS Take GLIPIZIDE ONE tablet DAILY with breakfast  Follow up in 4 weeks, or sooner as needed

## 2016-09-27 NOTE — Progress Notes (Signed)
   CC: Elevated blood glucose  HPI:  [name] is a 41 y.o. man with history of diabetes type 2 here with elevated blood glucose.  More stressed out. He has cut down on eating a lot of fried foods but still eats some. Eats more rice and potatoes. He does eat bread but tries to eat honey wheat. Having polyuria and polydipsia.   Takes 1500mg  of metformin. Has been somewhat more compliant.    Past Medical History:  Diagnosis Date  . Asthma    when young  . Back pain, chronic   . DM2 (diabetes mellitus, type 2) ([facility]) 11/18/2013  . Seasonal allergies     Review of Systems:   No fevers or chills No chest pain No dyspnea  Physical Exam:  Vitals:   09/27/16 1502  BP: (!) 144/81  Pulse: 93  Temp: 98 F (36.7 C)  TempSrc: Oral  SpO2: 99%  Weight: 235 lb 8 oz (106.8 kg)  Height: 5\' 9"  (1.753 m)   General Apperance: NAD HEENT: Normocephalic, atraumatic, anicteric sclera Neck: Supple, trachea midline Lungs: Clear to auscultation bilaterally. No wheezes, rhonchi or rales. Breathing comfortably Heart: Regular rate and rhythm, no murmur/rub/gallop Abdomen: Soft, nontender, nondistended, no rebound/guarding Extremities: Warm and well perfused, no edema, 1+ DP pulses bilaterally Skin: No rashes or lesions Neurologic: Alert and interactive. No gross deficits.  Assessment & Plan:   See Encounters Tab for problem based charting.  Patient discussed with Dr. Cleda DaubE. Hoffman

## 2016-09-28 LAB — GLUCOSE, CAPILLARY: Glucose-Capillary: 590 mg/dL (ref 65–99)

## 2016-09-28 NOTE — Assessment & Plan Note (Addendum)
Assessment: Home blood glucoses in the 300-500 range.   Plan:  Increase metformin to 1000mg  BID.  Add glipizide 5mg  daily Has a meter that he checks intermittently  Follow up in 4-6 weeks with meter Foot exam done today He is going to go to [facility] exam this week.

## 2016-10-01 NOTE — Progress Notes (Signed)
Internal Medicine Clinic Attending  Case discussed with Dr. Krall soon after the resident saw the patient.  We reviewed the resident's history and exam and pertinent patient test results.  I agree with the assessment, diagnosis, and plan of care documented in the resident's note. 

## 2016-10-06 ENCOUNTER — Telehealth: Payer: Self-pay

## 2016-10-06 NOTE — Telephone Encounter (Signed)
Thank you Helen.. I agree with the plan 

## 2016-10-06 NOTE — Telephone Encounter (Signed)
Needs to speak with a nurse about glipiZIDE (GLUCOTROL) 5 MG tablet. Please call back.

## 2016-10-06 NOTE — Telephone Encounter (Signed)
Pt calls and states hi cbg's are continuing to be in 300's, he has been taking meds as prescribed, appt mon 3/12 [facility] at 0915. Ask to call or go to ED or urg care this weekend if needed, he cannot come in Friday 3/9

## 2016-10-10 ENCOUNTER — Ambulatory Visit (INDEPENDENT_AMBULATORY_CARE_PROVIDER_SITE_OTHER): Payer: Self-pay | Admitting: Internal Medicine

## 2016-10-10 VITALS — BP 126/78 | HR 75 | Temp 98.1°F | Ht 69.0 in | Wt 235.2 lb

## 2016-10-10 DIAGNOSIS — Z7984 Long term (current) use of oral hypoglycemic drugs: Secondary | ICD-10-CM

## 2016-10-10 DIAGNOSIS — E1165 Type 2 diabetes mellitus with hyperglycemia: Secondary | ICD-10-CM

## 2016-10-10 DIAGNOSIS — E119 Type 2 diabetes mellitus without complications: Secondary | ICD-10-CM

## 2016-10-10 DIAGNOSIS — E669 Obesity, unspecified: Secondary | ICD-10-CM

## 2016-10-10 DIAGNOSIS — R0789 Other chest pain: Secondary | ICD-10-CM

## 2016-10-10 DIAGNOSIS — Z6834 Body mass index (BMI) 34.0-34.9, adult: Secondary | ICD-10-CM

## 2016-10-10 DIAGNOSIS — R079 Chest pain, unspecified: Secondary | ICD-10-CM

## 2016-10-10 LAB — GLUCOSE, CAPILLARY
GLUCOSE-CAPILLARY: 302 mg/dL — AB (ref 65–99)
Glucose-Capillary: 300 mg/dL — ABNORMAL HIGH (ref 65–99)

## 2016-10-10 LAB — POCT GLYCOSYLATED HEMOGLOBIN (HGB A1C): Hemoglobin A1C: 12.6

## 2016-10-10 MED ORDER — INSULIN NPH (HUMAN) (ISOPHANE) 100 UNIT/ML ~~LOC~~ SUSP: 10.0000 [IU] | Freq: Two times a day (BID) | SUBCUTANEOUS | 2 refills | Status: DC

## 2016-10-10 MED ORDER — NITROGLYCERIN 0.4 MG SL SUBL: 0.4000 mg | SUBLINGUAL_TABLET | SUBLINGUAL | 3 refills | Status: AC | PRN

## 2016-10-10 NOTE — Assessment & Plan Note (Addendum)
Uncontrolled today, A1C 12.6. Home glucose log reviewed. Average glucose over the past month was 342 (range 249 - 512). He endorses symptoms of fatigue, nausea, and occasional blurry vision. We discussed the need to restart his insulin and he is agreeable to this plan. He is somewhat discouraged today by his lack of progress with oral medication. Encouraged patient to work on improving his diet and to continue checking his glucose regularly at home.  -- Continue Metformin 1000 BID -- Glipizide 5mg  daily  -- Restart Novolin N 10 units BID with meals  -- Eye exam this week  -- Follow up 1 month

## 2016-10-10 NOTE — Patient Instructions (Addendum)
[name],  Your A1C today was 12.6. I have restarted your Insulin NPH. Please inject 10 units into the skin twice a day before meals. Please continue to check your sugar regularly and work on improving your diet. For your chest pain, I have referred you to cardiology for a stress test. I have also sent a prescription for sublingual nitroglycerin to your pharmacy. You may take this as needed for chest pain. Please return to clinic in 1 month for follow up. If you have any questions or concerns, call our clinic at [phone] or after hours call [phone] and ask for the internal medicine resident on call. Thank you!

## 2016-10-10 NOTE — Progress Notes (Signed)
Case discussed with Dr. Guilloud at the time of the visit. We reviewed the resident's history and exam and pertinent patient test results. I agree with the assessment, diagnosis, and plan of care documented in the resident's note. 

## 2016-10-10 NOTE — Assessment & Plan Note (Signed)
Patient is endorsing symptoms concerning for stable angina. He has been experiencing intermittent chest pressure that radiates to his bilateral arms x 1 month. Symptoms can as frequent as one to two times a day and usually occur while his is on his feet at work. He attributes his chest pain to stress related to caring for sick family members and stress associated with work. Given his ACS risk factors (obesity, uncontrolled DM) we will refer him for an exercise stress test. Patient is agreeable.  -- Cardiology referral  -- SL nitro prn for chest pain

## 2016-10-10 NOTE — Progress Notes (Signed)
   CC: DM follow up   HPI:  Justin Drake is a 41 y.o. M with pmhx outlined below here for DM follow up. He was seen in clinic three weeks ago at which point his metformin was increased to 1000 mg BID and he was started on Glipizide 5 mg daily due to elevated CBGs. He was previously on Novolin NPH 10 units BID but this was discontinued in October of 2016 due to his A1C being below goal. Since stopping insulin, A1C has begun to uptrend, now 12.6 from 8.6 three months ago. CBG today is 302. Home glucose log was reviewed today. Average BG over the past month was 342 with range of 249-512. Patient reports improvement in his symptoms of polyuria and polydipsia since starting the glipizide, but endorses symptoms of fatigue, nausea, and occasional blurry vision. Patient reports he is planning to go for an eye exam this week. He says his diet has been poor due to stress with caring for sick family members. He is also endorsing occasional chest pain that began about 1 month ago. Patient says chest pain usually occurs at work while he is on his feet. He works as a Neurosurgeonbarber cutting hair. He believes the chest pain is due to stress at home and work. He describes the chest pain as a pressure in his chest that radiates to both arms.   Past Medical History:  Diagnosis Date  . Asthma    when young  . Back pain, chronic   . DM2 (diabetes mellitus, type 2) (HCC) 11/18/2013  . Seasonal allergies     Review of Systems:  All pertinents listed in HPI, otherwise negative  Physical Exam:  Vitals:   10/10/16 0923  BP: 126/78  Pulse: 75  Temp: 98.1 F (36.7 C)  TempSrc: Oral  SpO2: 100%  Weight: 235 lb 3.2 oz (106.7 kg)  Height: 5\' 9"  (1.753 m)    Constitutional: NAD, appears comfortable Cardiovascular: RRR, no murmurs, rubs, or gallops.  Pulmonary/Chest: CTAB, no wheezes, rales, or rhonchi.  Abdominal: Soft, non tender, non distended. +BS.  Extremities: Warm and well perfused. Distal pulses  intact. No edema.  Neurological: A&Ox3, CN II - XII grossly intact.   Assessment & Plan:   See Encounters Tab for problem based charting.  Patient discussed with Dr. Josem KaufmannKlima

## 2016-10-12 ENCOUNTER — Telehealth: Payer: Self-pay | Admitting: Pulmonary Disease

## 2016-10-12 NOTE — Telephone Encounter (Signed)
Verified that pt is to continue taking metformin and glipizide

## 2016-10-12 NOTE — Telephone Encounter (Signed)
TALKED WITH PATIENT, HE IS A SELF EMPLOYED BARBER, I HAVE TO HAVE HIS 2017 TAX RETURN TO DO NEW APPLICATION FOR ASSISTANCE. HE SAID HE DIDN'T KNOW WHEN HE WOULD GET HIS TAXES FILED. I TOLD HIM WE WERE HOLDING A REFERRAL FOR CARDIOLOGY UNTIL HE COMPLETED THE FINANCIAL APP.

## 2016-10-12 NOTE — Telephone Encounter (Signed)
Questions about meds please call °

## 2016-10-27 ENCOUNTER — Telehealth: Payer: Self-pay

## 2016-10-27 NOTE — Telephone Encounter (Signed)
Needs to speak with a nurse about glipiZIDE (GLUCOTROL) 5 MG tablet. Please call pt back.

## 2016-10-27 NOTE — Telephone Encounter (Signed)
Pt calls and states he has stopped as of yesterday the glipizide due to the fact he thinks it is causing h/a's almost daily, mostly in pm and advil is not very effective. He states his cbg's are 110 to 120's. Has April appt with you

## 2016-10-30 NOTE — Telephone Encounter (Signed)
Called and spoke to patient on Friday evening. OK to stop glipizide for now. Discussed with him that he can monitor his blood glucose and restart glipizide at half dose if he wants to. Thanks!

## 2016-11-09 LAB — HM DIABETES EYE EXAM

## 2016-11-23 ENCOUNTER — Encounter: Payer: Self-pay | Admitting: *Deleted

## 2016-11-24 ENCOUNTER — Ambulatory Visit (INDEPENDENT_AMBULATORY_CARE_PROVIDER_SITE_OTHER): Payer: Self-pay | Admitting: Pulmonary Disease

## 2016-11-24 ENCOUNTER — Encounter: Payer: Self-pay | Admitting: Pulmonary Disease

## 2016-11-24 DIAGNOSIS — Z794 Long term (current) use of insulin: Secondary | ICD-10-CM

## 2016-11-24 DIAGNOSIS — E119 Type 2 diabetes mellitus without complications: Secondary | ICD-10-CM

## 2016-11-24 LAB — GLUCOSE, CAPILLARY: Glucose-Capillary: 146 mg/dL — ABNORMAL HIGH (ref 65–99)

## 2016-11-24 MED ORDER — INSULIN NPH (HUMAN) (ISOPHANE) 100 UNIT/ML ~~LOC~~ SUSP: 8.0000 [IU] | Freq: Two times a day (BID) | SUBCUTANEOUS | 2 refills | Status: DC

## 2016-11-24 NOTE — Progress Notes (Signed)
   CC: diabetes  HPI:  [name] is a 41 y.o. man here for diabetes follow up.  A couple of episodes of hypoglycemia. Was able to correct with PO intake.   Only taking  daily of the metformin. Stopped glipizide due to headache. Taking Novolin 10u twice a day.  Past Medical History:  Diagnosis Date  . Asthma    when young  . Back pain, chronic   . DM2 (diabetes mellitus, type 2) (HCC) 11/18/2013  . Seasonal allergies     Review of Systems:   Mild dyspnea associated with allergies No dysuria   Physical Exam:  Vitals:   11/24/16 1420  BP: 135/85  Pulse: 98  Temp: 98.3 F (36.8 C)  TempSrc: Oral  SpO2: 98%  Weight: 244 lb 1.6 oz (110.7 kg)  Height:  (1.753 m)   General Apperance: NAD HEENT: Normocephalic, atraumatic, anicteric sclera Neck: Supple, trachea midline Lungs: Clear to auscultation bilaterally. No wheezes, rhonchi or rales. Breathing comfortably Heart: Regular rate and rhythm, no murmur/rub/gallop Abdomen: Soft, nontender, nondistended, no rebound/guarding Extremities: Warm and well perfused, no edema Skin: No rashes or lesions Neurologic: Alert and interactive. No gross deficits.  Assessment & Plan:   See Encounters Tab for problem based charting.  Patient discussed with Dr. Rogelia Boga

## 2016-11-24 NOTE — Patient Instructions (Addendum)
Increase your metformin to three tablets ( ) a day Decrease your insulin to 8 units twice a day before meals.  If your fasting glucose is less than 100, decrease your insulin by one unit.   Goal fasting glucose is 80 to 130

## 2016-11-25 NOTE — Assessment & Plan Note (Signed)
Assessment: Glucose meter readings greatly improved to less than 200 for all readings in last few weeks. He reports two episodes of symptomatic hypoglycemia - he did not measure during that time because he felt like his blood sugar was dropping quickly and wanted to raise back up over getting a recording.   Plan: Increase metformin back to 151m daily Discussed with patient decreasing insulin from Novolin 10u BID to 8u BID depending on his fasting readings as well as episodes of symptomatic hypoglycemia Also discussed with him measuring his blood glucose soon after he has the episodes of symptomatic hypoglycemia Will refer him to diabetes educator. Also asked him to met with financial advisor so he can have access to more oral agents

## 2016-11-28 NOTE — Progress Notes (Signed)
Internal Medicine Clinic Attending  Case discussed with Dr. Krall soon after the resident saw the patient.  We reviewed the resident's history and exam and pertinent patient test results.  I agree with the assessment, diagnosis, and plan of care documented in the resident's note. 

## 2016-12-02 ENCOUNTER — Encounter: Payer: Self-pay | Admitting: *Deleted

## 2016-12-05 ENCOUNTER — Encounter: Payer: Self-pay | Admitting: Dietician

## 2016-12-05 ENCOUNTER — Telehealth: Payer: Self-pay | Admitting: Pulmonary Disease

## 2016-12-05 ENCOUNTER — Ambulatory Visit (INDEPENDENT_AMBULATORY_CARE_PROVIDER_SITE_OTHER): Payer: Self-pay | Admitting: Dietician

## 2016-12-05 DIAGNOSIS — Z794 Long term (current) use of insulin: Secondary | ICD-10-CM

## 2016-12-05 DIAGNOSIS — Z6836 Body mass index (BMI) 36.0-36.9, adult: Secondary | ICD-10-CM

## 2016-12-05 DIAGNOSIS — Z713 Dietary counseling and surveillance: Secondary | ICD-10-CM

## 2016-12-05 DIAGNOSIS — E119 Type 2 diabetes mellitus without complications: Secondary | ICD-10-CM

## 2016-12-05 NOTE — Patient Instructions (Addendum)
Try to complete as much as possible and neatness does not count- the Accu chek 360 view and bring back with you.  We talked  b   about self managing your diabetes today-  Clearing or find ways around barriers to doing what you want to do- exercise  Sounds like you are making healthy choices!  Diabetes can be a burden- please let us know if you feel like it is too much.

## 2016-12-05 NOTE — Progress Notes (Signed)
Diabetes Self-Management Education  Visit Type:  Follow-up  Appt. Start Time: 930 Appt. End Time: 1000  12/05/2016  [name], identified by name and date of birth, is a 41 y.o. male with a diagnosis of Diabetes: Type 2.   ASSESSMENT  CBG was 114 at visit- had taken insulin 1-2 hours prior And not eaten. Reports fasting CBG prior to insulin today was 135 His stated he is thinking about getting more exercise, but has not formed a plan as yet. Is I the contemplative stage.  His meals and insulin are variable.   Weight 246 lb 9.6 oz (111.9 kg). Body mass index is 36.42 kg/m.       Diabetes Self-Management Education - 12/05/16 1300      Health Coping   How would you rate your overall health? Fair     Psychosocial Assessment   Patient Belief/Attitude about Diabetes Motivated to manage diabetes   Self-care barriers Lack of material resources   Self-management support Doctor's office;Family;CDE visits   Patient Concerns Healthy Lifestyle;Problem Solving   Special Needs None   Preferred Learning Style No preference indicated   Learning Readiness Ready     Pre-Education Assessment   Patient understands the diabetes disease and treatment process. Demonstrates understanding / competency   Patient understands incorporating nutritional management into lifestyle. --  not his concern today   Patient undertands incorporating physical activity into lifestyle. Needs Review   Patient understands using medications safely. Needs Review   Patient understands monitoring blood glucose, interpreting and using results Needs Review   Patient understands how to develop strategies to promote health/change behavior. Needs Review     Complications   Last HgB A1C per patient/outside source 12.6 %   How often do you check your blood sugar? (P)  1-2 times/day   Fasting Blood glucose range (mg/dL) (P)  [phone]   Number of hypoglycemic episodes per month (P)  --  1   Number of hyperglycemic  episodes per week (P)  1   Can you tell when your blood sugar is high? (P)  Yes     Subsequent Visit   Since your last visit have you continued or begun to take your medications as prescribed? Yes   Since your last visit have you had your blood pressure checked? Yes   Is your most recent blood pressure lower, unchanged, or higher since your last visit? Unchanged   Since your last visit have you experienced any weight changes? Gain   Weight Gain (lbs) 10   Since your last visit, are you checking your blood glucose at least once a day? Yes      Learning Objective:  Patient will have a greater understanding of diabetes self-management. Patient education plan is to attend individual and/or group sessions per assessed needs and concerns. My plan to support myself in continuing these changes to care for my diabetes is to attend or contact:  Type 2 diabetes support group : 2nd Monday of every month from 6-7 PM at [address] [facility] conference room [phone] Or  Other ? Add other local support resources -doctor's office, CDE, Dietitian, pharmacist, church Or Journals ? Diabetes Forecast- [phone]- DirectoryTags.siwww.diabetesforecast.org ? Diabetes Self-Management- [phone]- www.diabetesselfmanagement.com  educator know if you need help accessing the websites.   Plan:   Patient Instructions  Try to complete as much as possible and neatness does not count- the Accu chek 360 view and bring back with you.  We talked  b  about self managing your diabetes today-  Clearing or find ways around barriers to doing what you want to do- exercise  Sounds like you are making healthy choices!  Diabetes can be a burden- please let us know if you feel like it is too much.     Expected Outcomes:     Education material provided: Accu chek 360 view  If problems or questions, patient to contact team via:  Phone  Future DSME appointment: -    Plyler, Lupita Leash, RD 12/05/2016 2:10 PM.

## 2016-12-05 NOTE — Telephone Encounter (Signed)
TALKED WITH PATIENT, HE STILL HASN'T FILED HIS 2017 TAXES  AND HE IS SELF EMPLOYED, SAID HE WILL TRY AND FILE THEM THE END OF [place]

## 2016-12-13 ENCOUNTER — Ambulatory Visit: Payer: Self-pay | Admitting: Cardiology

## 2016-12-29 ENCOUNTER — Ambulatory Visit: Payer: Self-pay

## 2017-01-02 ENCOUNTER — Ambulatory Visit (INDEPENDENT_AMBULATORY_CARE_PROVIDER_SITE_OTHER): Payer: Self-pay | Admitting: Internal Medicine

## 2017-01-02 ENCOUNTER — Ambulatory Visit: Payer: Self-pay

## 2017-01-02 ENCOUNTER — Ambulatory Visit: Payer: Self-pay | Admitting: Dietician

## 2017-01-02 VITALS — BP 124/79 | HR 74 | Temp 98.4°F | Wt 250.6 lb

## 2017-01-02 DIAGNOSIS — Z794 Long term (current) use of insulin: Secondary | ICD-10-CM

## 2017-01-02 DIAGNOSIS — E119 Type 2 diabetes mellitus without complications: Secondary | ICD-10-CM

## 2017-01-02 LAB — GLUCOSE, CAPILLARY: Glucose-Capillary: 131 mg/dL — ABNORMAL HIGH (ref 65–99)

## 2017-01-02 MED ORDER — INSULIN NPH (HUMAN) (ISOPHANE) 100 UNIT/ML ~~LOC~~ SUSP: 6.0000 [IU] | Freq: Two times a day (BID) | SUBCUTANEOUS | 2 refills | Status: DC

## 2017-01-02 NOTE — Patient Instructions (Addendum)
It was a pleasure to see you today Justin Drake.  I would recommend decreasing your insulin dose to no more than 6 units twice daily before meals. Keep taking metformin 1500mg  once daily.  Keep working on [employer] Holdingsyour diet and activity. You are doing great.

## 2017-01-02 NOTE — Progress Notes (Signed)
   CC: Follow up for diabetes  HPI:  Justin Drake is a 41 y.o. man here at 5 weeks follow up after medication changes for type 2 diabetes.   See problem based assessment and plan below for additional details  Past Medical History:  Diagnosis Date  . Asthma    when young  . Back pain, chronic   . DM2 (diabetes mellitus, type 2) (HCC) 11/18/2013  . Seasonal allergies     Review of Systems:  Review of Systems  Constitutional: Negative for diaphoresis and weight loss.  Eyes: Negative for blurred vision.  Cardiovascular: Negative for leg swelling.  Genitourinary: Negative for frequency.  Endo/Heme/Allergies: Negative for polydipsia.  Psychiatric/Behavioral: The patient does not have insomnia.     Physical Exam: Physical Exam  Constitutional: He is well-developed, well-nourished, and in no distress.  HENT:  Mouth/Throat: Oropharynx is clear and moist. No oropharyngeal exudate.  Cardiovascular: Normal rate and regular rhythm.   Musculoskeletal: Normal range of motion. He exhibits no tenderness.  Trace pedal edema b/l  Skin: Skin is warm and dry.  Psychiatric: Affect normal.    Vitals:   01/02/17 1008  BP: 124/79  Pulse: 74  Temp: 98.4 F (36.9 C)  SpO2: 100%  Weight: 250 lb 9.6 oz (113.7 kg)     Assessment & Plan:   See Encounters Tab for problem based charting.  Patient discussed with Dr. Heide SparkNarendra

## 2017-01-02 NOTE — Progress Notes (Signed)
Internal Medicine Clinic Attending  Case discussed with Dr. Rice at the time of the visit.  We reviewed the resident's history and exam and pertinent patient test results.  I agree with the assessment, diagnosis, and plan of care documented in the resident's note.  

## 2017-01-02 NOTE — Assessment & Plan Note (Addendum)
HPI: He is taking his insulin 8U BID before breakfast and dinner and metformin 1500mg  daily. He occasionally skips his evening dose due to checking CBGs around 100 prior to dinner. His glucometer report ranges from 77-156. He says he starts to feel a bit lightheaded or clammy with blood sugar in the 70s. He has gained about 5 pounds over the past few months with taking insulin.  A: Well controlled type 2 diabetes. He is currently overtreated and having to skip doses so a lower dose would be more consistent. He state he cannot tolerate metformin at a higher dose due to stomach cramping and diarrhea. He still has no insurance for prescription costs. He would likely not require insulin with some more exercise and weight loss, or additional oral medications.  P: Continue metformin 1500mg  daily Decrease insulin to 6U BID before meals RTC at 8wks or after setup with financial advisor

## 2017-01-04 ENCOUNTER — Telehealth: Payer: Self-pay | Admitting: Dietician

## 2017-01-04 NOTE — Telephone Encounter (Signed)
Mr. Cletus GashWitherspoon was busy and asked for a call back tomorrow about 11 AM.

## 2017-01-11 NOTE — Telephone Encounter (Signed)
Calling to offer Justin Drake support and follow up by phone. He was busy and could not talk. I suggested he call me back at his convenience as needed.

## 2017-01-16 ENCOUNTER — Encounter: Payer: Self-pay | Admitting: *Deleted

## 2017-04-10 ENCOUNTER — Ambulatory Visit: Payer: Self-pay

## 2017-04-12 ENCOUNTER — Ambulatory Visit (INDEPENDENT_AMBULATORY_CARE_PROVIDER_SITE_OTHER): Payer: Self-pay | Admitting: Internal Medicine

## 2017-04-12 VITALS — BP 121/79 | HR 81 | Temp 98.6°F | Ht 69.0 in | Wt 251.0 lb

## 2017-04-12 DIAGNOSIS — E119 Type 2 diabetes mellitus without complications: Secondary | ICD-10-CM

## 2017-04-12 DIAGNOSIS — Z794 Long term (current) use of insulin: Secondary | ICD-10-CM

## 2017-04-12 LAB — BASIC METABOLIC PANEL
ANION GAP: 8 (ref 5–15)
BUN: 8 mg/dL (ref 6–20)
CALCIUM: 9.4 mg/dL (ref 8.9–10.3)
CO2: 27 mmol/L (ref 22–32)
Chloride: 102 mmol/L (ref 101–111)
Creatinine, Ser: 1.06 mg/dL (ref 0.61–1.24)
Glucose, Bld: 191 mg/dL — ABNORMAL HIGH (ref 65–99)
Potassium: 4 mmol/L (ref 3.5–5.1)
Sodium: 137 mmol/L (ref 135–145)

## 2017-04-12 LAB — POCT GLYCOSYLATED HEMOGLOBIN (HGB A1C): Hemoglobin A1C: 7.3

## 2017-04-12 LAB — GLUCOSE, CAPILLARY: GLUCOSE-CAPILLARY: 215 mg/dL — AB (ref 65–99)

## 2017-04-12 MED ORDER — METFORMIN HCL ER 500 MG PO TB24: 1500.0000 mg | ORAL_TABLET | Freq: Once | ORAL | 4 refills | Status: DC

## 2017-04-12 MED ORDER — INSULIN NPH (HUMAN) (ISOPHANE) 100 UNIT/ML ~~LOC~~ SUSP: 8.0000 [IU] | Freq: Two times a day (BID) | SUBCUTANEOUS | 2 refills | Status: DC

## 2017-04-12 NOTE — Progress Notes (Signed)
   CC: Type 2 diabetes mellitus  HPI:  [name] is a 41 y.o. with past medial history as documented below presenting for Type 2 DM follow up.   Past Medical History:  Diagnosis Date  . Asthma    when young  . Back pain, chronic   . DM2 (diabetes mellitus, type 2) (HCC) 11/18/2013  . Seasonal allergies    Review of Systems:   Review of Systems  Constitutional: Negative for chills, fever and weight loss.  Respiratory: Negative for shortness of breath.   Cardiovascular: Negative for chest pain and palpitations.  Gastrointestinal: Negative for diarrhea, nausea and vomiting.    Physical Exam:  Vitals:   04/12/17 1329  BP: 121/79  Pulse: 81  Temp: 98.6 F (37 C)  TempSrc: Oral  SpO2: 98%  Weight: 251 lb (113.9 kg)  Height: 5\' 9"  (1.753 m)   General: Siting comfortably in chair, NAD HEENT: [place]/AT, EOMI, no scleral icterus Cardiac: RRR, No R/M/G appreciated Pulm: normal effort, CTAB Abd: soft, non tender, non distended, BS normal Ext: extremities well perfused, no peripheral edema Neuro: alert and oriented X3, cranial nerves II-XII grossly intact   Assessment & Plan:   See Encounters Tab for problem based charting.  Patient seen with Dr. Oswaldo Drake

## 2017-04-12 NOTE — Patient Instructions (Addendum)
[name],   It was a pleasure meeting you today!  Please continue your current insulin regimen: 8 units twice a day before breakfast and dinner, and 1500 mg of metformin daily. I have refilled your prescriptions.   I am getting some blood work today to check your kidney function and cholesterol levels.   Please follow up in clinic in 3-4 months.

## 2017-04-12 NOTE — Assessment & Plan Note (Addendum)
Last recorded A1c was in March 2018 = 12.6. A1C today was 7.3. The patient's BG is within target 70%, and above target 30% out a total of 54 readings. The patient's BG that are above goal are all in the AM and almost all of his BGs are within goal in the PM. He has had no hypoglycemic events since his previous clinic visit in 12/2016. He states he is motivated to make changes to his diet and would like to lose weight. No recent labs for renal function or lipids.   ASSESSMENT: Improved, well controlled.   PLAN: -Continue NPH 8 units BID prior to breakfast and dinner -Continue Metformin 1500 mg QD -Continue BG check twice daily in the AM and PM prior to meals -Lipid panel and BMET today -Follow up in 3-4 months  Addendum 04/13/2017:  -LDL 71, HDL 41 -Cr increased from 1 year ago 0.87>>1.06, but still WNL -Electrolytes normal

## 2017-04-13 LAB — LIPID PANEL
CHOLESTEROL TOTAL: 147 mg/dL (ref 100–199)
Chol/HDL Ratio: 3.6 ratio (ref 0.0–5.0)
HDL: 41 mg/dL (ref >39)
LDL Calculated: 71 mg/dL (ref 0–99)
TRIGLYCERIDES: 177 mg/dL — AB (ref 0–149)
VLDL CHOLESTEROL CAL: 35 mg/dL (ref 5–40)

## 2017-04-13 NOTE — Addendum Note (Signed)
Addended by: Erlinda HongVINCENT, DUNCAN T on: 04/13/2017 09:46 AM   Modules accepted: Level of Service

## 2017-04-13 NOTE — Progress Notes (Signed)
Internal Medicine Clinic Attending  I saw and evaluated the patient.  I personally confirmed the key portions of the history and exam documented by Dr. LaCroce and I reviewed pertinent patient test results.  The assessment, diagnosis, and plan were formulated together and I agree with the documentation in the resident's note.  

## 2017-04-22 ENCOUNTER — Other Ambulatory Visit: Payer: Self-pay | Admitting: Internal Medicine

## 2017-04-22 DIAGNOSIS — E119 Type 2 diabetes mellitus without complications: Secondary | ICD-10-CM

## 2017-05-23 ENCOUNTER — Ambulatory Visit: Payer: Self-pay

## 2017-05-29 ENCOUNTER — Encounter: Payer: Self-pay | Admitting: Internal Medicine

## 2017-05-29 ENCOUNTER — Ambulatory Visit: Payer: Self-pay

## 2017-07-04 ENCOUNTER — Other Ambulatory Visit: Payer: Self-pay

## 2017-07-04 DIAGNOSIS — E119 Type 2 diabetes mellitus without complications: Secondary | ICD-10-CM

## 2017-07-04 MED ORDER — INSULIN NPH (HUMAN) (ISOPHANE) 100 UNIT/ML ~~LOC~~ SUSP: 8.0000 [IU] | Freq: Two times a day (BID) | SUBCUTANEOUS | 1 refills | Status: DC

## 2017-07-04 NOTE — Telephone Encounter (Signed)
   insulin NPH Human (NOVOLIN N) 100 UNIT/ML injection   Refill request @ [facility] on [address].

## 2017-07-04 NOTE — Telephone Encounter (Signed)
Has not seen PCP. Has [facility] appt Dec. Will fill.

## 2017-07-10 ENCOUNTER — Ambulatory Visit: Payer: Self-pay

## 2017-07-17 ENCOUNTER — Ambulatory Visit: Payer: Self-pay

## 2017-07-19 ENCOUNTER — Ambulatory Visit: Payer: Self-pay | Admitting: Internal Medicine

## 2017-07-19 ENCOUNTER — Encounter (INDEPENDENT_AMBULATORY_CARE_PROVIDER_SITE_OTHER): Payer: Self-pay

## 2017-07-19 ENCOUNTER — Other Ambulatory Visit: Payer: Self-pay

## 2017-07-19 VITALS — BP 133/89 | HR 80 | Temp 98.2°F | Ht 69.0 in | Wt 253.7 lb

## 2017-07-19 DIAGNOSIS — Z794 Long term (current) use of insulin: Secondary | ICD-10-CM

## 2017-07-19 DIAGNOSIS — E119 Type 2 diabetes mellitus without complications: Secondary | ICD-10-CM

## 2017-07-19 LAB — POCT GLYCOSYLATED HEMOGLOBIN (HGB A1C): Hemoglobin A1C: 7.4

## 2017-07-19 LAB — GLUCOSE, CAPILLARY
GLUCOSE-CAPILLARY: 127 mg/dL — AB (ref 65–99)
GLUCOSE-CAPILLARY: 123 mg/dL — AB (ref 65–99)

## 2017-07-19 MED ORDER — INSULIN NPH (HUMAN) (ISOPHANE) 100 UNIT/ML ~~LOC~~ SUSP: 10.0000 [IU] | Freq: Two times a day (BID) | SUBCUTANEOUS | 1 refills | Status: DC

## 2017-07-19 MED ORDER — METFORMIN HCL ER 500 MG PO TB24: 1500.0000 mg | ORAL_TABLET | Freq: Once | ORAL | 6 refills | Status: DC

## 2017-07-19 NOTE — Patient Instructions (Addendum)
FOLLOW-UP INSTRUCTIONS When: 3 months For: Diabetes follow up What to bring: Prescription medications  Thank you for seeing us in the clinic today!  You were evaluated for diabetes. Your A1C was at goal of 7.4% today! You are doing a great job! Please continue taking your metformin every day. Take you insulin, 10 units twice a day as well. Continue checking your blood sugars and please let us know if you have any problems with your medications. Continue to modify your diet so that it is low in sugar, this along with exercise, will help you loose weight and maintain good control of your diabetes!  Please return to the clinic in 3 months for follow up of your Diabetes and an A1C check.  If you have any questions or concerns, please call our clinic at [phone] between the hours of 9am-5pm. If you have a problem after these hours, please call [phone] and ask for the internal medicine resident on call. If you feel you are having a medical emergency please call 911.   Thanks, Dr. Jeanella FlatteryMarybeth Aws Shere

## 2017-07-19 NOTE — Assessment & Plan Note (Addendum)
Previous A1C = 7.3%. His A1C today = 7.4%, indicating his diabetes is well controlled and stable on current medication regimen of metformin 1500 mg daily and 10 units NPH BID.   Review of patient's BG log showed no episodes of hypoglycemia. Average reading 176, with ranges from 101-272. Patient states that he sometimes forgets to check BG before meals, so some of the higher readings are post-prandial.   Given above, will plan to continue current regimen since it is working well for him. Patient already had blood work at previous visit with PCP in 04/2017 and Cr stable at that time. No additional testing needed today.  Plan: -Continue metformin 1500 mg daily, 10 units NPH BID, refills of medication provided -Continue checking BG BID before meals -RTC in 3 months for A1C check

## 2017-07-19 NOTE — Progress Notes (Signed)
   CC: T2DM follow up  HPI:  [name] is a 41 y.o. with PMH of T2DM who presents for follow up of his chronic medical conditions. Per chart review patient last seen by PCP in clinic on 04/12/17. At that time A1C was 7.3%. At that time he was instructed to take 8 units Novolin BID and 1500 mg metformin daily.   Since that visit the patient has felt well and he has not acute complaints today. He has been able to take all prescription medications without difficulty. About 1 month ago the patient noticed that his blood sugar was high (>200) on multiple occassions, so he started increasing the amount of insulin he gives himself every day. He has been taking 10 units twice a day for about 1 month now. Since self-adjusting his medication dose the patient has not had any episodes of dizziness/jitteriness or diaphoresis, which he knows to be associated with hypoglycemia as he has experienced this in the past.   Past Medical History: Past Medical History:  Diagnosis Date  . Asthma    when young  . Back pain, chronic   . DM2 (diabetes mellitus, type 2) (HCC) 11/18/2013  . Seasonal allergies    Review of Systems:  Patient denies chest pain, shortness of breath, abdominal pain, diaphoresis, nausea/vomiting, lower extremity swelling, and change in bowel/bladder habits.  Physical Exam:  Vitals:   07/19/17 1422  BP: 133/89  Pulse: 80  Temp: 98.2 F (36.8 C)  TempSrc: Oral  SpO2: 99%  Weight: 253 lb 11.2 oz (115.1 kg)  Height: 5\' 9"  (1.753 m)   Physical Exam  Constitutional: He appears well-developed and well-nourished. No distress.  HENT:  Mouth/Throat: Oropharynx is clear and moist.  Cardiovascular: Normal rate and regular rhythm. Exam reveals no friction rub.  No murmur heard. Respiratory: Effort normal. No respiratory distress. He has no wheezes.  Musculoskeletal: He exhibits no edema (of bilateral lower extremities) or tenderness (of bilateral lower extremities).  Skin:  Skin is warm and dry. No rash noted. He is not diaphoretic. No erythema.  Psychiatric: He has a normal mood and affect. His behavior is normal.   Assessment & Plan:   See Encounters Tab for problem based charting.  Patient seen with Dr. Oswaldo DoneVincent

## 2017-07-21 NOTE — Addendum Note (Signed)
Addended by: Erlinda HongVINCENT, Jazmeen Axtell T on: 07/21/2017 09:51 AM   Modules accepted: Level of Service

## 2017-07-21 NOTE — Progress Notes (Signed)
Internal Medicine Clinic Attending  I saw and evaluated the patient.  I personally confirmed the key portions of the history and exam documented by Dr. Nedrud and I reviewed pertinent patient test results.  The assessment, diagnosis, and plan were formulated together and I agree with the documentation in the resident's note.  

## 2017-09-19 ENCOUNTER — Other Ambulatory Visit: Payer: Self-pay

## 2017-09-19 ENCOUNTER — Encounter (INDEPENDENT_AMBULATORY_CARE_PROVIDER_SITE_OTHER): Payer: Self-pay

## 2017-09-19 ENCOUNTER — Ambulatory Visit: Payer: Self-pay | Admitting: Internal Medicine

## 2017-09-19 VITALS — BP 134/79 | HR 89 | Temp 98.2°F | Ht 69.0 in | Wt 257.7 lb

## 2017-09-19 DIAGNOSIS — J069 Acute upper respiratory infection, unspecified: Secondary | ICD-10-CM | POA: Insufficient documentation

## 2017-09-19 DIAGNOSIS — R05 Cough: Secondary | ICD-10-CM

## 2017-09-19 DIAGNOSIS — B9789 Other viral agents as the cause of diseases classified elsewhere: Principal | ICD-10-CM

## 2017-09-19 DIAGNOSIS — R0602 Shortness of breath: Secondary | ICD-10-CM

## 2017-09-19 DIAGNOSIS — Z8701 Personal history of pneumonia (recurrent): Secondary | ICD-10-CM

## 2017-09-19 DIAGNOSIS — J3489 Other specified disorders of nose and nasal sinuses: Secondary | ICD-10-CM

## 2017-09-19 NOTE — Assessment & Plan Note (Signed)
See HPI for full details.  Assessment and plan Patient's exam is reassuring and lungs are clear to auscultation.  However given his symptoms that proved and then worsened with worsening respiratory symptoms as well as his apprehension based on previous episode of walking pneumonia will check a chest x-ray today.  Otherwise we will treat symptomatically as I suspect this is most likely a viral URI.

## 2017-09-19 NOTE — Patient Instructions (Signed)
Mr. Cletus GashWitherspoon,  I would like to get a chest xray today to make sure nothing more serious is going on. I will let you know the results. This is most likely a viral infection and it will take some time to get over. If you are feeling a lot worse or aren't getting any better in the next couple of weeks please let us know.

## 2017-09-19 NOTE — Progress Notes (Signed)
   CC: Cough, sinus congestion  HPI:  [name] is a 42 y.o. male with a past medical history listed below here today with complaints of sinus congestion and cough.  She notes that 3-4 weeks ago he had viral URI symptoms that resolved after a week or 2.  He was doing well for 5 to 7 days but unfortunately developed recurrent symptoms.  He notes rhinorrhea, sinus congestion and pressure, cough with scant mucus production as well as shortness of breath.  He denies any fevers or chills, nausea or vomiting, diarrhea, abdominal pain, ear pain or myalgias.  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  He reports he is concerned because he has had a walking pneumonia in his youth and felt similar to this.  No one in his family is sick.  He did not receive a flu shot this year.  Past Medical History:  Diagnosis Date  . Asthma    when young  . Back pain, chronic   . DM2 (diabetes mellitus, type 2) (HCC) 11/18/2013  . Seasonal allergies    Review of Systems:   Negative except as noted in HPI  Physical Exam:  Vitals:   09/19/17 1552 09/19/17 1555  BP: 134/79   Pulse: 89   Temp: 98.2 F (36.8 C)   TempSrc: Oral   SpO2: 99% 99%  Weight: 257 lb 11.2 oz (116.9 kg)   Height: 5\' 9"  (1.753 m)    GENERAL- alert, co-operative, appears as stated age, not in any distress. HEENT- Atraumatic, normocephalic, PERRL, EOMI, oral mucosa appears moist CARDIAC- RRR, no murmurs, rubs or gallops. RESP- Moving equal volumes of air, and clear to auscultation bilaterally, no wheezes or crackles. ABDOMEN- Soft, nontender, bowel sounds present. NEURO- No obvious Cr N abnormality. EXTREMITIES- pulse 2+, symmetric, no pedal edema. SKIN- Warm, dry, No rash or lesion. PSYCH- Normal mood and affect, appropriate thought content and speech.   Assessment & Plan:   See Encounters Tab for problem based charting.  Patient discussed with Dr. Rogelia BogaButcher

## 2017-09-22 NOTE — Progress Notes (Signed)
Internal Medicine Clinic Attending  Case discussed with Dr. Karma GreaserBoswell at the time of the visit.  We reviewed the resident's history and exam and pertinent patient test results.  I agree with the assessment, diagnosis, and plan of care documented in the resident's note. CXR not completed on the 19th as ordered.

## 2017-11-02 ENCOUNTER — Encounter: Payer: Self-pay | Admitting: Internal Medicine

## 2017-11-23 ENCOUNTER — Encounter: Payer: Self-pay | Admitting: Internal Medicine

## 2017-12-28 ENCOUNTER — Ambulatory Visit: Payer: Self-pay | Admitting: Internal Medicine

## 2017-12-28 ENCOUNTER — Other Ambulatory Visit: Payer: Self-pay

## 2017-12-28 ENCOUNTER — Encounter: Payer: Self-pay | Admitting: Internal Medicine

## 2017-12-28 VITALS — BP 128/80 | HR 86 | Temp 99.2°F | Ht 69.0 in | Wt 255.2 lb

## 2017-12-28 DIAGNOSIS — E559 Vitamin D deficiency, unspecified: Secondary | ICD-10-CM

## 2017-12-28 DIAGNOSIS — E119 Type 2 diabetes mellitus without complications: Secondary | ICD-10-CM

## 2017-12-28 DIAGNOSIS — Z794 Long term (current) use of insulin: Secondary | ICD-10-CM

## 2017-12-28 DIAGNOSIS — Z0000 Encounter for general adult medical examination without abnormal findings: Secondary | ICD-10-CM

## 2017-12-28 DIAGNOSIS — D696 Thrombocytopenia, unspecified: Secondary | ICD-10-CM

## 2017-12-28 DIAGNOSIS — R079 Chest pain, unspecified: Secondary | ICD-10-CM

## 2017-12-28 DIAGNOSIS — E785 Hyperlipidemia, unspecified: Secondary | ICD-10-CM

## 2017-12-28 LAB — POCT GLYCOSYLATED HEMOGLOBIN (HGB A1C): HEMOGLOBIN A1C: 7.1 % — AB (ref 4.0–5.6)

## 2017-12-28 LAB — GLUCOSE, CAPILLARY: GLUCOSE-CAPILLARY: 155 mg/dL — AB (ref 65–99)

## 2017-12-28 MED ORDER — INSULIN NPH (HUMAN) (ISOPHANE) 100 UNIT/ML ~~LOC~~ SUSP: 15.0000 [IU] | Freq: Two times a day (BID) | SUBCUTANEOUS | 1 refills | Status: DC

## 2017-12-28 MED ORDER — ATORVASTATIN CALCIUM 10 MG PO TABS: 10.0000 mg | ORAL_TABLET | Freq: Every day | ORAL | 3 refills | Status: DC

## 2017-12-28 NOTE — Patient Instructions (Addendum)
FOLLOW-UP INSTRUCTIONS When: 3 months For: diabetes management What to bring: medications   Justin Drake,  It was good to meet you.  You are doing a great job with your diabetes. Your hemoglobin A1c is 7.1 today. Please continue taking your medicines.  We are starting a medicine for your cholesterol. - Please start atorvastatin  once a day  We will have you meet with our financial counselor in order to help get you in to see the heart doctors.  If you have any questions or concerns, call our clinic at [phone] or after hours call [phone] and ask for the internal medicine resident on call.

## 2017-12-28 NOTE — Assessment & Plan Note (Addendum)
Assessment Will start medium-intensity statin today, based on patient's history of diabetes and ASCVD risk of 6.5%. Advised patient on potential side effects and to call us if he had issues with the new medication.  Plan - Check baseline LFTs - Start atorvastatin  QHS - F/u in 3 months - Check LFTs at next visit  ADDENDUM: Baseline LFTs normal

## 2017-12-28 NOTE — Assessment & Plan Note (Addendum)
Assessment Last CBC from 2015 with WBC 3.8 and plt 141. No active bleeding or bruising.  Plan - Recheck CBC  ADDENDUM: Platelets normal at 159. CBC notable for WBC 3.2. Will continue to monitor

## 2017-12-28 NOTE — Assessment & Plan Note (Signed)
Assessment Patient continues to endorse intermittent chest pain on his left chest wall that radiates to bilateral arms. The chest pain can occur when he is at rest or with exertion. He was previously seen in clinic for this, but was unable to complete the Cardiology referral as patient does not have insurance. He is interested in meeting with the financial counselor in order to assist with getting this referral done. Likely needs an exercise stress test  He was given NTG tablets at last visit, and states that he keeps them with him. Has not had to use one.  Plan - Meeting with Rudell Cobb today - Will benefit from Cardiology referral once able

## 2017-12-28 NOTE — Progress Notes (Signed)
   CC: management of diabetes  HPI:  [name] is a 42 y.o. male with PMH of type 2 diabetes who presents for management of diabetes.  Please see the assessment and plan below for the status of the patient's chronic medical problems.  Past Medical History:  Diagnosis Date  . Asthma    when young  . Back pain, chronic   . DM2 (diabetes mellitus, type 2) (HCC) 11/18/2013  . Seasonal allergies    Review of Systems:   GEN: Negative for fevers CV: Positive for intermittent chest pain  PULM: Negative for cough or SOB EXT: Negative for LE swelling  Physical Exam:  Vitals:   12/28/17 1440  BP: 128/80  Pulse: 86  Temp: 99.2 F (37.3 C)  TempSrc: Oral  SpO2: 100%  Weight: 255 lb 3.2 oz (115.8 kg)  Height:  (1.753 m)   GEN: Sitting comfortably in chair in NAD CV: NR & RR, no m/r/g, no tenderness to palpation of anterior chest wall PULM: CTAB, no wheezes or rales EXT: No LE edema  Assessment & Plan:   See Encounters Tab for problem based charting.  Patient discussed with Dr. Cyndie Chime

## 2017-12-28 NOTE — Assessment & Plan Note (Addendum)
Assessment A1c 7.1 today. Reports missing 1-2 doses of insulin per month. Average glucometer reading is 148 (range 83-211). Tolerating current medications well. He states that with the  of metformin, he had GI side effects that prevented him from being able to continue the higher dose, but on , he has been doing well. He denies hypoglycemic episodes. He feels that his blood sugars are higher after having decreased his insulin, however he is no longer having hypoglycemic episodes. Thus, will continue with his current regimen for now. Counseled on lifestyle modifications, including types of foods to avoid and exercise. He stated that he is interested in going to a water aerobics class.  Plan - Continue metformin XR  daily - Continue Novolin N 15u BID - Counseled on lifestyle modifications - F/u in 3 months

## 2017-12-28 NOTE — Assessment & Plan Note (Signed)
Stable - Continue cholecalciferol 1000 units daily

## 2017-12-29 LAB — HEPATIC FUNCTION PANEL
ALT: 16 IU/L (ref 0–44)
AST: 16 IU/L (ref 0–40)
Albumin: 4.3 g/dL (ref 3.5–5.5)
Alkaline Phosphatase: 71 IU/L (ref 39–117)
BILIRUBIN TOTAL: 0.3 mg/dL (ref 0.0–1.2)
Bilirubin, Direct: 0.11 mg/dL (ref 0.00–0.40)
Total Protein: 6.8 g/dL (ref 6.0–8.5)

## 2017-12-29 LAB — CBC
HEMOGLOBIN: 14.1 g/dL (ref 13.0–17.7)
Hematocrit: 40.5 % (ref 37.5–51.0)
MCH: 32.3 pg (ref 26.6–33.0)
MCHC: 34.8 g/dL (ref 31.5–35.7)
MCV: 93 fL (ref 79–97)
Platelets: 159 10*3/uL (ref 150–450)
RBC: 4.37 x10E6/uL (ref 4.14–5.80)
RDW: 13.6 % (ref 12.3–15.4)
WBC: 3.2 10*3/uL — ABNORMAL LOW (ref 3.4–10.8)

## 2017-12-29 NOTE — Progress Notes (Signed)
Medicine attending: Medical history, presenting problems, physical findings, and medications, reviewed with resident physician Dr Jennifer Huang on the day of the patient visit and I concur with her evaluation and management plan. 

## 2018-01-08 ENCOUNTER — Ambulatory Visit: Payer: Self-pay

## 2018-01-21 ENCOUNTER — Encounter: Payer: Self-pay | Admitting: *Deleted

## 2018-01-23 ENCOUNTER — Encounter: Payer: Self-pay | Admitting: Internal Medicine

## 2018-01-23 ENCOUNTER — Ambulatory Visit: Payer: Self-pay

## 2018-07-12 ENCOUNTER — Encounter: Payer: Self-pay | Admitting: Internal Medicine

## 2018-08-02 ENCOUNTER — Encounter: Payer: Self-pay | Admitting: Internal Medicine

## 2018-08-23 ENCOUNTER — Encounter: Payer: Self-pay | Admitting: Internal Medicine

## 2018-08-27 ENCOUNTER — Encounter: Payer: Self-pay | Admitting: Internal Medicine

## 2018-11-25 ENCOUNTER — Other Ambulatory Visit: Payer: Self-pay | Admitting: Internal Medicine

## 2018-11-25 DIAGNOSIS — E119 Type 2 diabetes mellitus without complications: Secondary | ICD-10-CM

## 2018-12-19 ENCOUNTER — Encounter: Payer: Self-pay | Admitting: Family Medicine

## 2018-12-19 ENCOUNTER — Ambulatory Visit: Payer: Self-pay | Attending: Family Medicine | Admitting: Family Medicine

## 2018-12-19 ENCOUNTER — Other Ambulatory Visit: Payer: Self-pay

## 2018-12-19 VITALS — BP 128/86 | HR 70 | Temp 98.1°F | Ht 69.0 in | Wt 249.0 lb

## 2018-12-19 DIAGNOSIS — Z794 Long term (current) use of insulin: Secondary | ICD-10-CM

## 2018-12-19 DIAGNOSIS — E1165 Type 2 diabetes mellitus with hyperglycemia: Secondary | ICD-10-CM

## 2018-12-19 DIAGNOSIS — J301 Allergic rhinitis due to pollen: Secondary | ICD-10-CM

## 2018-12-19 LAB — POCT GLYCOSYLATED HEMOGLOBIN (HGB A1C): Hemoglobin A1C: 9.5 % — AB (ref 4.0–5.6)

## 2018-12-19 LAB — POCT CBG (FASTING - GLUCOSE)-MANUAL ENTRY: Glucose Fasting, POC: 224 mg/dL — AB (ref 70–99)

## 2018-12-19 MED ORDER — METFORMIN HCL ER 500 MG PO TB24: ORAL_TABLET | ORAL | 5 refills | Status: DC

## 2018-12-19 MED ORDER — INSULIN NPH (HUMAN) (ISOPHANE) 100 UNIT/ML ~~LOC~~ SUSP: 18.0000 [IU] | Freq: Two times a day (BID) | SUBCUTANEOUS | 3 refills | Status: DC

## 2018-12-19 MED FILL — METFORMIN HCL ER 500 MG TAB: 500 | 30 days supply | Qty: 60 | Fill #0

## 2018-12-19 NOTE — Patient Instructions (Signed)
Diabetes Mellitus and Nutrition, Adult  When you have diabetes (diabetes mellitus), it is very important to have healthy eating habits because your blood sugar (glucose) levels are greatly affected by what you eat and drink. Eating healthy foods in the appropriate amounts, at about the same times every day, can help you:  · Control your blood glucose.  · Lower your risk of heart disease.  · Improve your blood pressure.  · Reach or maintain a healthy weight.  Every person with diabetes is different, and each person has different needs for a meal plan. Your health care provider may recommend that you work with a diet and nutrition specialist (dietitian) to make a meal plan that is best for you. Your meal plan may vary depending on factors such as:  · The calories you need.  · The medicines you take.  · Your weight.  · Your blood glucose, blood pressure, and cholesterol levels.  · Your activity level.  · Other health conditions you have, such as heart or kidney disease.  How do carbohydrates affect me?  Carbohydrates, also called carbs, affect your blood glucose level more than any other type of food. Eating carbs naturally raises the amount of glucose in your blood. Carb counting is a method for keeping track of how many carbs you eat. Counting carbs is important to keep your blood glucose at a healthy level, especially if you use insulin or take certain oral diabetes medicines.  It is important to know how many carbs you can safely have in each meal. This is different for every person. Your dietitian can help you calculate how many carbs you should have at each meal and for each snack.  Foods that contain carbs include:  · Bread, cereal, rice, pasta, and crackers.  · Potatoes and corn.  · Peas, beans, and lentils.  · Milk and yogurt.  · Fruit and juice.  · Desserts, such as cakes, cookies, ice cream, and candy.  How does alcohol affect me?  Alcohol can cause a sudden decrease in blood glucose (hypoglycemia),  especially if you use insulin or take certain oral diabetes medicines. Hypoglycemia can be a life-threatening condition. Symptoms of hypoglycemia (sleepiness, dizziness, and confusion) are similar to symptoms of having too much alcohol.  If your health care provider says that alcohol is safe for you, follow these guidelines:  · Limit alcohol intake to no more than 1 drink per day for nonpregnant women and 2 drinks per day for men. One drink equals 12 oz of beer, 5 oz of wine, or 1½ oz of hard liquor.  · Do not drink on an empty stomach.  · Keep yourself hydrated with water, diet soda, or unsweetened iced tea.  · Keep in mind that regular soda, juice, and other mixers may contain a lot of sugar and must be counted as carbs.  What are tips for following this plan?    Reading food labels  · Start by checking the serving size on the "Nutrition Facts" label of packaged foods and drinks. The amount of calories, carbs, fats, and other nutrients listed on the label is based on one serving of the item. Many items contain more than one serving per package.  · Check the total grams (g) of carbs in one serving. You can calculate the number of servings of carbs in one serving by dividing the total carbs by 15. For example, if a food has 30 g of total carbs, it would be equal to 2   servings of carbs.  · Check the number of grams (g) of saturated and trans fats in one serving. Choose foods that have low or no amount of these fats.  · Check the number of milligrams (mg) of salt (sodium) in one serving. Most people should limit total sodium intake to less than 2,300 mg per day.  · Always check the nutrition information of foods labeled as "low-fat" or "nonfat". These foods may be higher in added sugar or refined carbs and should be avoided.  · Talk to your dietitian to identify your daily goals for nutrients listed on the label.  Shopping  · Avoid buying canned, premade, or processed foods. These foods tend to be high in fat, sodium,  and added sugar.  · Shop around the outside edge of the grocery store. This includes fresh fruits and vegetables, bulk grains, fresh meats, and fresh dairy.  Cooking  · Use low-heat cooking methods, such as baking, instead of high-heat cooking methods like deep frying.  · Cook using healthy oils, such as olive, canola, or sunflower oil.  · Avoid cooking with butter, cream, or high-fat meats.  Meal planning  · Eat meals and snacks regularly, preferably at the same times every day. Avoid going long periods of time without eating.  · Eat foods high in fiber, such as fresh fruits, vegetables, beans, and whole grains. Talk to your dietitian about how many servings of carbs you can eat at each meal.  · Eat 4-6 ounces (oz) of lean protein each day, such as lean meat, chicken, fish, eggs, or tofu. One oz of lean protein is equal to:  ? 1 oz of meat, chicken, or fish.  ? 1 egg.  ? ¼ cup of tofu.  · Eat some foods each day that contain healthy fats, such as avocado, nuts, seeds, and fish.  Lifestyle  · Check your blood glucose regularly.  · Exercise regularly as told by your health care provider. This may include:  ? 150 minutes of moderate-intensity or vigorous-intensity exercise each week. This could be brisk walking, biking, or water aerobics.  ? Stretching and doing strength exercises, such as yoga or weightlifting, at least 2 times a week.  · Take medicines as told by your health care provider.  · Do not use any products that contain nicotine or tobacco, such as cigarettes and e-cigarettes. If you need help quitting, ask your health care provider.  · Work with a counselor or diabetes educator to identify strategies to manage stress and any emotional and social challenges.  Questions to ask a health care provider  · Do I need to meet with a diabetes educator?  · Do I need to meet with a dietitian?  · What number can I call if I have questions?  · When are the best times to check my blood glucose?  Where to find more  information:  · American Diabetes Association: diabetes.org  · Academy of Nutrition and Dietetics: www.eatright.org  · National Institute of Diabetes and Digestive and Kidney Diseases (NIH): www.niddk.nih.gov  Summary  · A healthy meal plan will help you control your blood glucose and maintain a healthy lifestyle.  · Working with a diet and nutrition specialist (dietitian) can help you make a meal plan that is best for you.  · Keep in mind that carbohydrates (carbs) and alcohol have immediate effects on your blood glucose levels. It is important to count carbs and to use alcohol carefully.  This information is not intended to   replace advice given to you by your health care provider. Make sure you discuss any questions you have with your health care provider.  Document Released: 04/14/2005 Document Revised: 02/15/2017 Document Reviewed: 08/22/2016  [employer]vier Interactive Patient Education © 2019 [employer]

## 2018-12-19 NOTE — Progress Notes (Signed)
New Patient/DM/ Med refills/  CBG today is 224 in office and A1c today is 9.5

## 2018-12-19 NOTE — Progress Notes (Signed)
New Patient Office Visit  Subjective:  Patient ID: Justin Drake, male    DOB: May 02, 1976  Age: 43 y.o. MRN: [id number]  CC:  Chief Complaint  Patient presents with  . New Patient (Initial Visit)  Establish care of chronic medical issues including diabetes  HPI Justin Soberntoine M Goodlow presents to establish care and patient with diabetes which he states was diagnosed about 3 years ago.  He reports that at that time he was started on insulin therapy.  Patient reports that he continues to take his NPH insulin at 15 units twice daily but he reports that he started having issues with nausea and feeling as if his sugars had dropped when they were actually still normal while taking 1500 mg of metformin daily and he stopped the use of the metformin.  He believes that it may have been 4 or more weeks since he stopped metformin and he has noticed that his blood sugars have been higher during that time.  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  He has noticed some issues with increased urinary frequency as well as increased thirst especially when his blood sugars are higher such as after eating desserts or foods that he should avoid.  Patient does monitor his home blood sugars once or twice daily.  He has a [employer] meter and states that the strip refills are of a reasonable price.  Since stopping the metformin, blood sugars have been in the low 200s when they were previously in the 150s to 160s or less fasting.      Patient reports a past history of asthma when he was young but he believes that he has not had to use an inhaler since he was about 43 years old.  He does have seasonal allergies for which he now takes over-the-counter Allegra however he is currently out of this medication.  Patient did take Zyrtec and Claritin in the past but feels that these no longer work.  He  denies any current issues with facial pressure, no frontal headache and no dizziness.  He does stand on his feet all day while working as a Paediatric nursebarber and he has noticed that he will have some swelling in his left lower leg.  Swelling does go away overnight.  He does have a history of requiring bilateral arthroscopic knee surgery and he believes that the swelling in his left lower leg is related to prior left knee injury with meniscal and ligament injury.  He has a prior vitamin D deficiency and currently takes daily over-the-counter vitamin D.  Patient states that he was prescribed atorvastatin however he never filled the medication as he states that he did not really know why he needed to be on this medication as he thought that he had been told that his cholesterol was okay in the past.  Past Medical History:  Diagnosis Date  . Asthma    when young  . Back pain, chronic   . DM2 (diabetes mellitus, type 2) (HCC) 11/18/2013  . Seasonal allergies     Past Surgical History:  Procedure Laterality Date  . KNEE ARTHROSCOPY  april 1993 left, Oct 1994 right  . KNEE SURGERY  11/1990 05/1992   L and R knee    Family History  Problem Relation Age of Onset  . Diabetes Mother   . Renal Disease  Mother   . Hypertension Mother   . Kidney disease Mother        on kidney transplant list   . Diabetes Maternal Aunt   . Kidney disease Maternal Aunt   . Diabetes Maternal Grandmother   . Diabetes Maternal Grandfather   . Kidney disease Maternal Grandfather   . Diabetes Maternal Aunt   . Stroke Other        great grand mother  . Cancer Other        great uncle  . Migraines Father     Social History   Tobacco Use  . Smoking status: Never Smoker  . Smokeless tobacco: Never Used  Substance Use Topics  . Alcohol use: No    Alcohol/week: 0.0 standard drinks  . Drug use: No    ROS Review of Systems  Constitutional: Positive for fatigue (occasional). Negative for chills and fever.  HENT: Positive  for congestion, postnasal drip and rhinorrhea. Negative for sinus pressure, sinus pain, sore throat and trouble swallowing.   Eyes: Negative for photophobia and visual disturbance (does wear glasses).  Respiratory: Negative for cough, chest tightness, shortness of breath and wheezing.   Cardiovascular: Positive for leg swelling (tends to get swelling left lower leg by the end of the day). Negative for chest pain and palpitations.  Gastrointestinal: Negative for abdominal pain, blood in stool, constipation, diarrhea and nausea (resolved after stopping metformin).  Endocrine: Negative for cold intolerance, heat intolerance, polydipsia, polyphagia and polyuria.  Genitourinary: Negative for dysuria and frequency.  Musculoskeletal: Positive for arthralgias (occasional knee pain) and back pain (occasional).  Allergic/Immunologic: Positive for environmental allergies. Negative for food allergies.  Neurological: Negative for dizziness and headaches.  Hematological: Negative for adenopathy. Does not bruise/bleed easily.    Objective:   Today's Vitals: BP 128/86 (BP Location: Right Arm, Patient Position: Sitting, Cuff Size: Large)   Pulse 70   Temp 98.1 F (36.7 C) (Oral)   Ht 5\' 9"  (1.753 m)   Wt 249 lb (112.9 kg)   SpO2 97%   BMI 36.77 kg/m   Physical Exam Vitals signs and nursing note reviewed.  Constitutional:      General: He is not in acute distress.    Appearance: Normal appearance. He is obese. He is not ill-appearing.  HENT:     Right Ear: Tympanic membrane and ear canal normal.     Left Ear: Tympanic membrane and ear canal normal.     Nose: Congestion (Mild edema of the nasal turbinates which are slightly pale and boggy) and rhinorrhea (Scant clear nasal discharge) present.     Mouth/Throat:     Mouth: Mucous membranes are moist.     Pharynx: Posterior oropharyngeal erythema (Mild posterior pharynx erythema) present. No oropharyngeal exudate.  Eyes:     Extraocular Movements:  Extraocular movements intact.     Conjunctiva/sclera: Conjunctivae normal.     Comments: Patient is wearing glasses  Neck:     Musculoskeletal: Normal range of motion and neck supple. No neck rigidity or muscular tenderness.     Vascular: No carotid bruit.  Cardiovascular:     Rate and Rhythm: Normal rate and regular rhythm.     Pulses:          Dorsalis pedis pulses are 1+ on the right side and 1+ on the left side.       Posterior tibial pulses are 1+ on the right side and 1+ on the left side.     Heart sounds: Normal heart  sounds.  Pulmonary:     Effort: Pulmonary effort is normal.     Breath sounds: Normal breath sounds. No rales.  Chest:     Chest wall: No tenderness.  Abdominal:     Palpations: Abdomen is soft.     Tenderness: There is no abdominal tenderness. There is no right CVA tenderness, left CVA tenderness, guarding or rebound.  Musculoskeletal:        General: No tenderness.     Right lower leg: No edema.     Left lower leg: No edema.     Right foot: Normal range of motion. Deformity present. No bunion, Charcot foot, foot drop or prominent metatarsal heads.     Left foot: Normal range of motion. Deformity present. No bunion, Charcot foot, foot drop or prominent metatarsal heads.  Feet:     Right foot:     Protective Sensation: 10 sites tested. 10 sites sensed.     Skin integrity: Skin integrity normal.     Toenail Condition: Right toenails are normal.     Left foot:     Protective Sensation: 10 sites tested. 10 sites sensed.     Skin integrity: Skin integrity normal.     Toenail Condition: Left toenails are normal.     Comments: Patient with crowding of the little toes bilaterally; dry skin on the heels/bottoms of the feet Lymphadenopathy:     Cervical: No cervical adenopathy.  Skin:    General: Skin is warm and dry.  Neurological:     General: No focal deficit present.     Mental Status: He is alert and oriented to person, place, and time.  Psychiatric:         Mood and Affect: Mood normal.        Behavior: Behavior normal.     Assessment & Plan:  1. Uncontrolled type 2 diabetes mellitus with hyperglycemia (HCC) Patient has agreed to restart metformin XR and will initially start 500 mg after the evening meal and then advance next week to 2 pills, thousand milligrams once daily after the evening meal.  He should call or return to clinic if he has any difficulty tolerating the medication.  If after 2 weeks his blood sugars are still greater than 180 fasting, he should increase NPH to 18 units twice daily and continue to monitor blood sugars.  He has been asked to bring blood sugar diary to his next visit.  Patient declined prescription for glucometer to be sent to this pharmacy as he feels that his current glucometer works well and is affordable.  Patient did have elevated blood sugar at today's visit of 224 as well as hemoglobin A1c elevated at 9.5 as compared to hemoglobin A1c of 7.1 in May of last year.  Discussed hemoglobin A1c goal of 7 or less and fasting blood sugar goal of 120-130 or less.  CMP in follow-up of long-term use of medication as well as diabetes.  At patient's next visit he will have urine microalbumin/creatinine ratio, referral for diabetic eye exam and fasting lipid panel.  Patient is encouraged to apply to see if he is eligible for financial assistance/discount through this clinic. - Glucose (CBG), Fasting - HgB A1c - insulin NPH Human (NOVOLIN N) 100 UNIT/ML injection; Inject 0.18 mLs (18 Units total) into the skin 2 (two) times daily before a meal.  Dispense: 30 mL; Refill: 3 - metFORMIN (GLUCOPHAGE-XR) 500 MG 24 hr tablet; 2 tablets (1,000 mg) once per day after the evening meal  Dispense: 60 tablet; Refill: 5 - Comprehensive metabolic panel  2. Long-term insulin use (HCC) Refill provided of NPH and patient has been instructed on increasing the dose if his blood sugars remain elevated after he restarts the use of metformin.  Healthy  diet also encouraged - insulin NPH Human (NOVOLIN N) 100 UNIT/ML injection; Inject 0.18 mLs (18 Units total) into the skin 2 (two) times daily before a meal.  Dispense: 30 mL; Refill: 3 - Comprehensive metabolic panel  3. Seasonal allergic rhinitis due to pollen Patient will continue use of Allegra for seasonal allergic rhinitis.  Patient was offered albuterol inhaler in case of issues with chest tightness/wheezing due to prior history of asthma but patient declines albuterol inhaler at this time.   Outpatient Encounter Medications as of 12/19/2018  Medication Sig  . Cholecalciferol 1000 units tablet Take 1 tablet (1,000 Units total) by mouth daily.  . insulin NPH Human (NOVOLIN N) 100 UNIT/ML injection Inject 0.15 mLs (15 Units total) into the skin 2 (two) times daily before a meal.  . metFORMIN (GLUCOPHAGE-XR) 500 MG 24 hr tablet Take 3 tablets (1,500 mg total) by mouth once for 1 dose.  . nitroGLYCERIN (NITROSTAT) 0.4 MG SL tablet Place 1 tablet (0.4 mg total) under the tongue every 5 (five) minutes as needed for chest pain.  Marland Kitchen atorvastatin (LIPITOR) 10 MG tablet Take 1 tablet (10 mg total) by mouth daily at 6 PM. (Patient not taking: Reported on 12/19/2018)   No facility-administered encounter medications on file as of 12/19/2018.     Follow-up: Return in about 8 weeks (around 02/13/2019) for DM follow-up; fasting labs if possible.  Cain Saupe, MD

## 2018-12-20 LAB — COMPREHENSIVE METABOLIC PANEL WITH GFR
ALT: 19 IU/L (ref 0–44)
AST: 16 IU/L (ref 0–40)
Albumin/Globulin Ratio: 1.6 (ref 1.2–2.2)
Albumin: 4.6 g/dL (ref 4.0–5.0)
Alkaline Phosphatase: 104 IU/L (ref 39–117)
BUN/Creatinine Ratio: 8 — ABNORMAL LOW (ref 9–20)
BUN: 9 mg/dL (ref 6–24)
Bilirubin Total: 0.4 mg/dL (ref 0.0–1.2)
CO2: 24 mmol/L (ref 20–29)
Calcium: 10.2 mg/dL (ref 8.7–10.2)
Chloride: 100 mmol/L (ref 96–106)
Creatinine, Ser: 1.08 mg/dL (ref 0.76–1.27)
GFR calc Af Amer: 97 mL/min/1.73
GFR calc non Af Amer: 84 mL/min/1.73
Globulin, Total: 2.8 g/dL (ref 1.5–4.5)
Glucose: 227 mg/dL — ABNORMAL HIGH (ref 65–99)
Potassium: 4.7 mmol/L (ref 3.5–5.2)
Sodium: 138 mmol/L (ref 134–144)
Total Protein: 7.4 g/dL (ref 6.0–8.5)

## 2018-12-21 NOTE — Telephone Encounter (Signed)
Patient mychart concern.

## 2018-12-25 ENCOUNTER — Other Ambulatory Visit: Payer: Self-pay | Admitting: Family Medicine

## 2018-12-25 DIAGNOSIS — J4520 Mild intermittent asthma, uncomplicated: Secondary | ICD-10-CM

## 2018-12-25 MED ORDER — ALBUTEROL SULFATE HFA 108 (90 BASE) MCG/ACT IN AERS: 2.0000 | INHALATION_SPRAY | Freq: Four times a day (QID) | RESPIRATORY_TRACT | 2 refills | Status: DC | PRN

## 2018-12-25 MED FILL — !VENTOLIN HFA INHALER: 108 (90 BAS | 25 days supply | Qty: 18 | Fill #0

## 2018-12-25 NOTE — Telephone Encounter (Signed)
My chart response

## 2018-12-25 NOTE — Progress Notes (Signed)
Patient ID: Justin Drake, male   DOB: 1976/03/03, 43 y.o.   MRN: [id number]   See patient's My chart message. Patient wonders if needs an inhaler to help with chest tightness. I will send in a RX for an albuterol HFA and he will be encouraged to make a follow-up appointment if symptoms continue. He was offered an albuterol inhaler at his recent appointment due to childhood history of asthma but he declined the inhaler at that time.

## 2019-01-13 ENCOUNTER — Encounter: Payer: Self-pay | Admitting: Family Medicine

## 2019-01-14 NOTE — Telephone Encounter (Signed)
Patient is requesting assistance with anxiety via mychart. Please follow up accordingly.

## 2019-01-28 ENCOUNTER — Other Ambulatory Visit: Payer: Self-pay

## 2019-01-28 ENCOUNTER — Ambulatory Visit: Payer: Self-pay | Attending: Family Medicine | Admitting: Family Medicine

## 2019-01-28 ENCOUNTER — Encounter: Payer: Self-pay | Admitting: Family Medicine

## 2019-01-28 DIAGNOSIS — F411 Generalized anxiety disorder: Secondary | ICD-10-CM

## 2019-01-28 DIAGNOSIS — E1165 Type 2 diabetes mellitus with hyperglycemia: Secondary | ICD-10-CM

## 2019-01-28 MED ORDER — METFORMIN HCL ER 500 MG PO TB24: ORAL_TABLET | ORAL | 5 refills | Status: DC

## 2019-01-28 MED ORDER — SERTRALINE HCL 50 MG PO TABS: ORAL_TABLET | ORAL | 3 refills | Status: DC

## 2019-01-28 NOTE — Progress Notes (Signed)
Per pt he would like to talk about his anxiety and dm and his HR gets faster sometimes but now it's everyday.  Patient need to talk about his metformin due to being on recall .   Per pt he do not now if it's due to him holding in a lot of things over the years or not but he feels like when he gets really mad or upset he finds himself shaking.  Per pt 214 this morning.

## 2019-01-28 NOTE — Progress Notes (Signed)
Virtual Visit via Telephone Note  I connected with Myrtis SerAntoine Moffat on 01/28/19 at  3:50 PM EDT by telephone and verified that I am speaking with the correct person using two identifiers.   I discussed the limitations, risks, security and privacy concerns of performing an evaluation and management service by telephone and the availability of in person appointments. I also discussed with the patient that there may be a patient responsible charge related to this service. The patient expressed understanding and agreed to proceed.  Patient Location: Home Provider Location: Office Others participating in call: call initiated by Guillermina Cityctavia Richard, RMA   History of Present Illness:       43 year old male with complaint of issues with increased anxiety.  Patient states that he has always tended to worry more than other people and he states that with the recent COVID-19 pandemic he believes that this is caused him to have increased anxiety.  Patient states that this does affect his sleep as he sometimes has difficulty falling asleep going to worrying about life in general.  Patient has taken medication in the past and would be willing to restart medication.  He is not really sure of the names of the medications which he took prior.  He denies any suicidal thoughts or ideations.       Patient also had blood work done in May showing an increase in his hemoglobin A1c.  Patient has some occasional increased thirst but denies any urinary frequency or blurred vision.  He has been taking his NPH but had stopped the use of metformin and therefore needs to have a new prescription sent to his pharmacy.  Patient states that he was told that the metformin XR was no longer available and that is why he has not been taking it.  He believes that his fasting blood sugars have been around 1 60-1 70 at times.   Past Medical History:  Diagnosis Date  . Asthma    when young  . Back pain, chronic   . DM2 (diabetes  mellitus, type 2) (HCC) 11/18/2013  . Seasonal allergies     Past Surgical History:  Procedure Laterality Date  . KNEE ARTHROSCOPY  april 1993 left, Oct 1994 right  . KNEE SURGERY  11/1990 05/1992   L and R knee    Family History  Problem Relation Age of Onset  . Diabetes Mother   . Renal Disease Mother   . Hypertension Mother   . Kidney disease Mother        on kidney transplant list   . Diabetes Maternal Aunt   . Kidney disease Maternal Aunt   . Diabetes Maternal Grandmother   . Diabetes Maternal Grandfather   . Kidney disease Maternal Grandfather   . Diabetes Maternal Aunt   . Stroke Other        great grand mother  . Cancer Other        great uncle  . Migraines Father     Social History   Tobacco Use  . Smoking status: Never Smoker  . Smokeless tobacco: Never Used  Substance Use Topics  . Alcohol use: No    Alcohol/week: 0.0 standard drinks  . Drug use: No     No Known Allergies     Observations/Objective: No vital signs or physical exam conducted as visit was done via telephone  Assessment and Plan: 1. GAD (generalized anxiety disorder) He reports longstanding issues with generalized anxiety and has been on medication in the past  but he cannot recall which medication.  He is willing to restart medication to help with anxiety.  Discussed options and patient agrees to take Zoloft/sertraline and he will initially start with half pill 25 mg at bedtime for 6 nights then increase to 1 whole pill daily.  He was made aware that it may take up to 4 weeks for him to feel as well on the medication as it will take time for the medication to reach maximum effectiveness.  He is aware that if he has any issues with the medication he should call or return to clinic.  If he develops any suicidal thoughts or ideations he should follow-up at the emergency department.  Patient will return to clinic in approximately 6 weeks for reevaluation of his anxiety and medications and he has also  agreed with the medication dose can be increased if needed. - sertraline (ZOLOFT) 50 MG tablet; Take 1/2 pill at bedtime for 6 nights then 1 pill nightly  Dispense: 30 tablet; Refill: 3  2. Uncontrolled type 2 diabetes mellitus with hyperglycemia [facility]) New prescription will be sent to patient's pharmacy for non-extended uppercase metformin as the extended release form is currently on back order.  Message was sent to pharmacy to substitute non-extended metformin for metformin XR.  Continue to monitor blood sugars low-fat/low-carb diet along with regular exercise with goal hemoglobin A1c of 7.0 or less as well as goal fasting blood sugar between 80-1 20 and a blood sugar of 140 or worse within 2 hours of a meal. - metFORMIN (GLUCOPHAGE-XR) 500 MG 24 hr tablet; 2 tablets (1,000 mg) once per day after the evening meal  Dispense: 60 tablet; Refill: 5  Follow Up Instructions:Return in about 6 weeks (around 03/11/2019).   I discussed the assessment and treatment plan with the patient. The patient was provided an opportunity to ask questions and all were answered. The patient agreed with the plan and demonstrated an understanding of the instructions.   The patient was advised to call back or seek an in-person evaluation if the symptoms worsen or if the condition fails to improve as anticipated.  I provided 13  minutes of non-face-to-face time during this encounter.   Antony Blackbird, MD

## 2019-02-13 ENCOUNTER — Encounter: Payer: Self-pay | Admitting: Family Medicine

## 2019-02-13 ENCOUNTER — Ambulatory Visit: Payer: Self-pay | Attending: Family Medicine | Admitting: Family Medicine

## 2019-02-13 ENCOUNTER — Other Ambulatory Visit: Payer: Self-pay

## 2019-02-13 VITALS — BP 120/84 | HR 68 | Temp 98.6°F | Ht 69.0 in | Wt 252.0 lb

## 2019-02-13 DIAGNOSIS — E785 Hyperlipidemia, unspecified: Secondary | ICD-10-CM

## 2019-02-13 DIAGNOSIS — F411 Generalized anxiety disorder: Secondary | ICD-10-CM

## 2019-02-13 DIAGNOSIS — E1165 Type 2 diabetes mellitus with hyperglycemia: Secondary | ICD-10-CM

## 2019-02-13 MED ORDER — SERTRALINE HCL 100 MG PO TABS: ORAL_TABLET | ORAL | 1 refills | Status: DC

## 2019-02-13 MED ORDER — ATORVASTATIN CALCIUM 10 MG PO TABS: 10.0000 mg | ORAL_TABLET | Freq: Every day | ORAL | 3 refills | Status: DC

## 2019-02-13 NOTE — Patient Instructions (Signed)
Dyslipidemia Dyslipidemia is an imbalance of waxy, fat-like substances (lipids) in the blood. The body needs lipids in small amounts. Dyslipidemia often involves a high level of cholesterol or triglycerides, which are types of lipids. Common forms of dyslipidemia include:  High levels of LDL cholesterol. LDL is the type of cholesterol that causes fatty deposits (plaques) to build up in the blood vessels that carry blood away from your heart (arteries).  Low levels of HDL cholesterol. HDL cholesterol is the type of cholesterol that protects against heart disease. High levels of HDL remove the LDL buildup from arteries.  High levels of triglycerides. Triglycerides are a fatty substance in the blood that is linked to a buildup of plaques in the arteries. What are the causes? Primary dyslipidemia is caused by changes (mutations) in genes that are passed down through families (inherited). These mutations cause several types of dyslipidemia. Secondary dyslipidemia is caused by lifestyle choices and diseases that lead to dyslipidemia, such as:  Eating a diet that is high in animal fat.  Not getting enough exercise.  Having diabetes, kidney disease, liver disease, or thyroid disease.  Drinking large amounts of alcohol.  Using certain medicines. What increases the risk? You are more likely to develop this condition if you are an older man or if you are a woman who has gone through menopause. Other risk factors include:  Having a family history of dyslipidemia.  Taking certain medicines, including birth control pills, steroids, some diuretics, and beta-blockers.  Smoking cigarettes.  Eating a high-fat diet.  Having certain medical conditions such as diabetes, polycystic ovary syndrome (PCOS), kidney disease, liver disease, or hypothyroidism.  Not exercising regularly.  Being overweight or obese with too much belly fat. What are the signs or symptoms? In most cases, dyslipidemia does not  usually cause any symptoms. In severe cases, very high lipid levels can cause:  Fatty bumps under the skin (xanthomas).  White or gray ring around the black center (pupil) of the eye. Very high triglyceride levels can cause inflammation of the pancreas (pancreatitis). How is this diagnosed? Your health care provider may diagnose dyslipidemia based on a routine blood test (fasting blood test). Because most people do not have symptoms of the condition, this blood testing (lipid profile) is done on adults age 42 and older and is repeated every 5 years. This test checks:  Total cholesterol. This measures the total amount of cholesterol in your blood, including LDL cholesterol, HDL cholesterol, and triglycerides. A healthy number is below 200.  LDL cholesterol. The target number for LDL cholesterol is different for each person, depending on individual risk factors. Ask your health care provider what your LDL cholesterol should be.  HDL cholesterol. An HDL level of 60 or higher is best because it helps to protect against heart disease. A number below 45 for men or below 64 for women increases the risk for heart disease.  Triglycerides. A healthy triglyceride number is below 150. If your lipid profile is abnormal, your health care provider may do other blood tests. How is this treated? Treatment depends on the type of dyslipidemia that you have and your other risk factors for heart disease and stroke. Your health care provider will have a target range for your lipid levels based on this information. For many people, this condition may be treated by lifestyle changes, such as diet and exercise. Your health care provider may recommend that you:  Get regular exercise.  Make changes to your diet.  Quit smoking if you  smoke. If diet changes and exercise do not help you reach your goals, your health care provider may also prescribe medicine to lower lipids. The most commonly prescribed type of medicine  lowers your LDL cholesterol (statin drug). If you have a high triglyceride level, your provider may prescribe another type of drug (fibrate) or an omega-3 fish oil supplement, or both. Follow these instructions at home:  Eating and drinking  Follow instructions from your health care provider or dietitian about eating or drinking restrictions.  Eat a healthy diet as told by your health care provider. This can help you reach and maintain a healthy weight, lower your LDL cholesterol, and raise your HDL cholesterol. This may include: ? Limiting your calories, if you are overweight. ? Eating more fruits, vegetables, whole grains, fish, and lean meats. ? Limiting saturated fat, trans fat, and cholesterol.  If you drink alcohol: ? Limit how much you use. ? Be aware of how much alcohol is in your drink. In the [place] one drink equals one 12 oz bottle of beer (355 mL), one 5 oz glass of wine (148 mL), or one 1 oz glass of hard liquor (44 mL).  Do not drink alcohol if: ? Your health care provider tells you not to drink. ? You are pregnant, may be pregnant, or are planning to become pregnant. Activity  Get regular exercise. Start an exercise and strength training program as told by your health care provider. Ask your health care provider what activities are safe for you. Your health care provider may recommend: ? 30 minutes of aerobic activity 4-6 days a week. Brisk walking is an example of aerobic activity. ? Strength training 2 days a week. General instructions  Do not use any products that contain nicotine or tobacco, such as cigarettes, e-cigarettes, and chewing tobacco. If you need help quitting, ask your health care provider.  Take over-the-counter and prescription medicines only as told by your health care provider. This includes supplements.  Keep all follow-up visits as told by your health care provider. Contact a health care provider if:  You are: ? Having trouble sticking to your  exercise or diet plan. ? Struggling to quit smoking or control your use of alcohol. Summary  Dyslipidemia often involves a high level of cholesterol or triglycerides, which are types of lipids.  Treatment depends on the type of dyslipidemia that you have and your other risk factors for heart disease and stroke.  For many people, treatment starts with lifestyle changes, such as diet and exercise.  Your health care provider may prescribe medicine to lower lipids. This information is not intended to replace advice given to you by your health care provider. Make sure you discuss any questions you have with your health care provider. Document Released: 07/23/2013 Document Revised: 03/12/2018 Document Reviewed: 02/16/2018 Elsevier Patient Education  2020 [employer]  High Cholesterol  High cholesterol is a condition in which the blood has high levels of a white, waxy, fat-like substance (cholesterol). The human body needs small amounts of cholesterol. The liver makes all the cholesterol that the body needs. Extra (excess) cholesterol comes from the food that we eat. Cholesterol is carried from the liver by the blood through the blood vessels. If you have high cholesterol, deposits (plaques) may build up on the walls of your blood vessels (arteries). Plaques make the arteries narrower and stiffer. Cholesterol plaques increase your risk for heart attack and stroke. Work with your health care provider to keep your cholesterol levels in  a healthy range. What increases the risk? This condition is more likely to develop in people who:  Eat foods that are high in animal fat (saturated fat) or cholesterol.  Are overweight.  Are not getting enough exercise.  Have a family history of high cholesterol. What are the signs or symptoms? There are no symptoms of this condition. How is this diagnosed? This condition may be diagnosed from the results of a blood test.  If you are older than age 42, your  health care provider may check your cholesterol every 4-6 years.  You may be checked more often if you already have high cholesterol or other risk factors for heart disease. The blood test for cholesterol measures:  "Bad" cholesterol (LDL cholesterol). This is the main type of cholesterol that causes heart disease. The desired level for LDL is less than 100.  "Good" cholesterol (HDL cholesterol). This type helps to protect against heart disease by cleaning the arteries and carrying the LDL away. The desired level for HDL is 60 or higher.  Triglycerides. These are fats that the body can store or burn for energy. The desired number for triglycerides is lower than 150.  Total cholesterol. This is a measure of the total amount of cholesterol in your blood, including LDL cholesterol, HDL cholesterol, and triglycerides. A healthy number is less than 200. How is this treated? This condition is treated with diet changes, lifestyle changes, and medicines. Diet changes  This may include eating more whole grains, fruits, vegetables, nuts, and fish.  This may also include cutting back on red meat and foods that have a lot of added sugar. Lifestyle changes  Changes may include getting at least 40 minutes of aerobic exercise 3 times a week. Aerobic exercises include walking, biking, and swimming. Aerobic exercise along with a healthy diet can help you maintain a healthy weight.  Changes may also include quitting smoking. Medicines  Medicines are usually given if diet and lifestyle changes have failed to reduce your cholesterol to healthy levels.  Your health care provider may prescribe a statin medicine. Statin medicines have been shown to reduce cholesterol, which can reduce the risk of heart disease. Follow these instructions at home: Eating and drinking If told by your health care provider:  Eat chicken (without skin), fish, veal, shellfish, ground [place] breast, and round or loin cuts of red  meat.  Do not eat fried foods or fatty meats, such as hot dogs and salami.  Eat plenty of fruits, such as apples.  Eat plenty of vegetables, such as broccoli, potatoes, and carrots.  Eat beans, peas, and lentils.  Eat grains such as barley, rice, couscous, and bulgur wheat.  Eat pasta without cream sauces.  Use skim or nonfat milk, and eat low-fat or nonfat yogurt and cheeses.  Do not eat or drink whole milk, cream, ice cream, egg yolks, or hard cheeses.  Do not eat stick margarine or tub margarines that contain trans fats (also called partially hydrogenated oils).  Do not eat saturated tropical oils, such as coconut oil and palm oil.  Do not eat cakes, cookies, crackers, or other baked goods that contain trans fats.  General instructions  Exercise as directed by your health care provider. Increase your activity level with activities such as gardening, walking, and taking the stairs.  Take over-the-counter and prescription medicines only as told by your health care provider.  Do not use any products that contain nicotine or tobacco, such as cigarettes and e-cigarettes. If you need  help quitting, ask your health care provider.  Keep all follow-up visits as told by your health care provider. This is important. Contact a health care provider if:  You are struggling to maintain a healthy diet or weight.  You need help to start on an exercise program.  You need help to stop smoking. Get help right away if:  You have chest pain.  You have trouble breathing. This information is not intended to replace advice given to you by your health care provider. Make sure you discuss any questions you have with your health care provider. Document Released: 07/18/2005 Document Revised: 07/21/2017 Document Reviewed: 01/16/2016 Elsevier Patient Education  2020 [employer]  Generalized Anxiety Disorder, Adult Generalized anxiety disorder (GAD) is a mental health disorder. People with  this condition constantly worry about everyday events. Unlike normal anxiety, worry related to GAD is not triggered by a specific event. These worries also do not fade or get better with time. GAD interferes with life functions, including relationships, work, and school. GAD can vary from mild to severe. People with severe GAD can have intense waves of anxiety with physical symptoms (panic attacks). What are the causes? The exact cause of GAD is not known. What increases the risk? This condition is more likely to develop in:  Women.  People who have a family history of anxiety disorders.  People who are very shy.  People who experience very stressful life events, such as the death of a loved one.  People who have a very stressful family environment. What are the signs or symptoms? People with GAD often worry excessively about many things in their lives, such as their health and family. They may also be overly concerned about:  Doing well at work.  Being on time.  Natural disasters.  Friendships. Physical symptoms of GAD include:  Fatigue.  Muscle tension or having muscle twitches.  Trembling or feeling shaky.  Being easily startled.  Feeling like your heart is pounding or racing.  Feeling out of breath or like you cannot take a deep breath.  Having trouble falling asleep or staying asleep.  Sweating.  Nausea, diarrhea, or irritable bowel syndrome (IBS).  Headaches.  Trouble concentrating or remembering facts.  Restlessness.  Irritability. How is this diagnosed? Your health care provider can diagnose GAD based on your symptoms and medical history. You will also have a physical exam. The health care provider will ask specific questions about your symptoms, including how severe they are, when they started, and if they come and go. Your health care provider may ask you about your use of alcohol or drugs, including prescription medicines. Your health care provider may  refer you to a mental health specialist for further evaluation. Your health care provider will do a thorough examination and may perform additional tests to rule out other possible causes of your symptoms. To be diagnosed with GAD, a person must have anxiety that:  Is out of his or her control.  Affects several different aspects of his or her life, such as work and relationships.  Causes distress that makes him or her unable to take part in normal activities.  Includes at least three physical symptoms of GAD, such as restlessness, fatigue, trouble concentrating, irritability, muscle tension, or sleep problems. Before your health care provider can confirm a diagnosis of GAD, these symptoms must be present more days than they are not, and they must last for six months or longer. How is this treated? The following therapies are usually  used to treat GAD:  Medicine. Antidepressant medicine is usually prescribed for long-term daily control. Antianxiety medicines may be added in severe cases, especially when panic attacks occur.  Talk therapy (psychotherapy). Certain types of talk therapy can be helpful in treating GAD by providing support, education, and guidance. Options include: ? Cognitive behavioral therapy (CBT). People learn coping skills and techniques to ease their anxiety. They learn to identify unrealistic or negative thoughts and behaviors and to replace them with positive ones. ? Acceptance and commitment therapy (ACT). This treatment teaches people how to be mindful as a way to cope with unwanted thoughts and feelings. ? Biofeedback. This process trains you to manage your body's response (physiological response) through breathing techniques and relaxation methods. You will work with a therapist while machines are used to monitor your physical symptoms.  Stress management techniques. These include yoga, meditation, and exercise. A mental health specialist can help determine which  treatment is best for you. Some people see improvement with one type of therapy. However, other people require a combination of therapies. Follow these instructions at home:  Take over-the-counter and prescription medicines only as told by your health care provider.  Try to maintain a normal routine.  Try to anticipate stressful situations and allow extra time to manage them.  Practice any stress management or self-calming techniques as taught by your health care provider.  Do not punish yourself for setbacks or for not making progress.  Try to recognize your accomplishments, even if they are small.  Keep all follow-up visits as told by your health care provider. This is important. Contact a health care provider if:  Your symptoms do not get better.  Your symptoms get worse.  You have signs of depression, such as: ? A persistently sad, cranky, or irritable mood. ? Loss of enjoyment in activities that used to bring you joy. ? Change in weight or eating. ? Changes in sleeping habits. ? Avoiding friends or family members. ? Loss of energy for normal tasks. ? Feelings of guilt or worthlessness. Get help right away if:  You have serious thoughts about hurting yourself or others. If you ever feel like you may hurt yourself or others, or have thoughts about taking your own life, get help right away. You can go to your nearest emergency department or call:  Your local emergency services (911 in the [place]  A suicide crisis helpline, such as the [place] at 603-394-9413. This is open 24 hours a day. Summary  Generalized anxiety disorder (GAD) is a mental health disorder that involves worry that is not triggered by a specific event.  People with GAD often worry excessively about many things in their lives, such as their health and family.  GAD may cause physical symptoms such as restlessness, trouble concentrating, sleep problems, frequent sweating,  nausea, diarrhea, headaches, and trembling or muscle twitching.  A mental health specialist can help determine which treatment is best for you. Some people see improvement with one type of therapy. However, other people require a combination of therapies. This information is not intended to replace advice given to you by your health care provider. Make sure you discuss any questions you have with your health care provider. Document Released: 11/12/2012 Document Revised: 06/30/2017 Document Reviewed: 06/07/2016 Elsevier Patient Education  2020 [employer].

## 2019-02-13 NOTE — Progress Notes (Signed)
Established Patient Office Visit  Subjective:  Patient ID: Justin Soberntoine M Betzold, male    DOB: 1976-06-27  Age: 43 y.o. MRN: [id number]  CC:  Chief Complaint  Patient presents with  . Diabetes  . Labs Only    HPI Justin Drake presents for follow-up of diabetes, hyperlipidemia and GAD. He reports that he has had DM eye exam in June at [facility] in [place] and no abnormalities found. He reports fasting BS in the 120's or less and no increased thirst, no urinary frequency and no blurred vision. He is taking the sertraline and has had a reduction in anxiety. Still has a little anxiety. He has found that he has had recurrent yawning after starting the medication. No suicidal thoughts or ideations.   He also needs a refill of atorvastatin.  No increase in muscle aches with use of atorvastatin.  Past Medical History:  Diagnosis Date  . Asthma    when young  . Back pain, chronic   . DM2 (diabetes mellitus, type 2) (HCC) 11/18/2013  . Seasonal allergies     Past Surgical History:  Procedure Laterality Date  . KNEE ARTHROSCOPY  april 1993 left, Oct 1994 right  . KNEE SURGERY  11/1990 05/1992   L and R knee    Family History  Problem Relation Age of Onset  . Diabetes Mother   . Renal Disease Mother   . Hypertension Mother   . Kidney disease Mother        on kidney transplant list   . Diabetes Maternal Aunt   . Kidney disease Maternal Aunt   . Diabetes Maternal Grandmother   . Diabetes Maternal Grandfather   . Kidney disease Maternal Grandfather   . Diabetes Maternal Aunt   . Stroke Other        great grand mother  . Cancer Other        great uncle  . Migraines Father     Social History   Tobacco Use  . Smoking status: Never Smoker  . Smokeless tobacco: Never Used  Substance Use Topics  . Alcohol use: No    Alcohol/week: 0.0 standard drinks  . Drug use: No    Outpatient Medications Prior to Visit  Medication Sig Dispense Refill  . albuterol  (VENTOLIN HFA) 108 (90 Base) MCG/ACT inhaler Inhale 2 puffs into the lungs every 6 (six) hours as needed for wheezing or shortness of breath. 1 Inhaler 2  . Cholecalciferol 1000 units tablet Take 1 tablet (1,000 Units total) by mouth daily. 30 tablet 2  . insulin NPH Human (NOVOLIN N) 100 UNIT/ML injection Inject 0.18 mLs (18 Units total) into the skin 2 (two) times daily before a meal. 30 mL 3  . metFORMIN (GLUCOPHAGE-XR) 500 MG 24 hr tablet 2 tablets (1,000 mg) once per day after the evening meal 60 tablet 5  . nitroGLYCERIN (NITROSTAT) 0.4 MG SL tablet Place 1 tablet (0.4 mg total) under the tongue every 5 (five) minutes as needed for chest pain. 100 tablet 3  . sertraline (ZOLOFT) 50 MG tablet Take 1/2 pill at bedtime for 6 nights then 1 pill nightly 30 tablet 3  . atorvastatin (LIPITOR) 10 MG tablet Take 1 tablet (10 mg total) by mouth daily at 6 PM. 90 tablet 3   No facility-administered medications prior to visit.     No Known Allergies  ROS Review of Systems  Constitutional: Positive for fatigue (mild). Negative for chills and fever.  HENT: Negative for sore throat and  trouble swallowing.   Eyes: Negative for photophobia and visual disturbance.  Respiratory: Negative for cough and shortness of breath.   Cardiovascular: Negative for chest pain, palpitations and leg swelling.  Gastrointestinal: Negative for abdominal pain, constipation, diarrhea and nausea.  Endocrine: Negative for polydipsia, polyphagia and polyuria.  Genitourinary: Negative for dysuria and frequency.  Musculoskeletal: Negative for arthralgias and back pain.  Neurological: Negative for dizziness and headaches.  Hematological: Negative for adenopathy. Does not bruise/bleed easily.  Psychiatric/Behavioral: Negative for self-injury and suicidal ideas. The patient is nervous/anxious (improved).       Objective:    Physical Exam  Constitutional: He is oriented to person, place, and time. He appears well-developed  and well-nourished.  Overweight for height male in NAD  Neck: Normal range of motion. Neck supple.  Cardiovascular: Normal rate and regular rhythm.  No CVA tenderness  Pulmonary/Chest: Effort normal and breath sounds normal.  Abdominal: Soft. There is no abdominal tenderness. There is no rebound and no guarding.  Musculoskeletal: Normal range of motion.        General: No tenderness or edema.  Lymphadenopathy:    He has no cervical adenopathy.  Neurological: He is alert and oriented to person, place, and time.  Skin: Skin is warm and dry.  Psychiatric: He has a normal mood and affect. His behavior is normal. Judgment and thought content normal.  No active skin breakdown on the feet  Nursing note and vitals reviewed. DM foot exam: normal monofilament exam, no active skin breakdown on the feet; little toes with mild deformity, normal peripheral pulses  BP 120/84 (BP Location: Right Arm, Patient Position: Sitting, Cuff Size: Large)   Pulse 68   Temp 98.6 F (37 C) (Oral)   Ht 5\' 9"  (1.753 m)   Wt 252 lb (114.3 kg)   SpO2 96%   BMI 37.21 kg/m  Wt Readings from Last 3 Encounters:  02/13/19 252 lb (114.3 kg)  12/19/18 249 lb (112.9 kg)  12/28/17 255 lb 3.2 oz (115.8 kg)     Health Maintenance Due  Topic Date Due  . URINE MICROALBUMIN  07/14/2017  . OPHTHALMOLOGY EXAM  11/09/2017  . LIPID PANEL  04/12/2018  . FOOT EXAM  12/29/2018    Lab Results  Component Value Date   TSH 0.859 02/04/2016   Lab Results  Component Value Date   WBC 3.2 (L) 12/28/2017   HGB 14.1 12/28/2017   HCT 40.5 12/28/2017   MCV 93 12/28/2017   PLT 159 12/28/2017   Lab Results  Component Value Date   NA 138 12/19/2018   K 4.7 12/19/2018   CO2 24 12/19/2018   GLUCOSE 227 (H) 12/19/2018   BUN 9 12/19/2018   CREATININE 1.08 12/19/2018   BILITOT 0.4 12/19/2018   ALKPHOS 104 12/19/2018   AST 16 12/19/2018   ALT 19 12/19/2018   PROT 7.4 12/19/2018   ALBUMIN 4.6 12/19/2018   CALCIUM 10.2  12/19/2018   ANIONGAP 8 04/12/2017   Lab Results  Component Value Date   CHOL 147 04/12/2017   Lab Results  Component Value Date   HDL 41 04/12/2017   Lab Results  Component Value Date   LDLCALC 71 04/12/2017   Lab Results  Component Value Date   TRIG 177 (H) 04/12/2017   Lab Results  Component Value Date   CHOLHDL 3.6 04/12/2017   Lab Results  Component Value Date   HGBA1C 9.5 (A) 12/19/2018      Assessment & Plan:  1. Uncontrolled type  2 diabetes mellitus with hyperglycemia ([place]) Patient reports improvement in his blood sugars. His last Hgb A1c was elevated at 9.5 on 12/19/2018. He will continue his current NPH, continue a healthy diet and he was encouraged to start a regular low impact cardiovascular exercise program along with efforts at weight loss. Continue monitoring of blood sugars. BMP, urine microalbumin/creatinine ratio and lipid panel at today's visit.  - Basic Metabolic Panel - Lipid panel -urine microalbumin/creatinine ratio  2. GAD (generalized anxiety disorder) Anxiety improved however patient would like to see if a higher dose would help further decrease his anxiety. New RX provided for sertraline with increase to 100 mg once per day. If he does not tolerate this dose or prefers the 50 mg then he can break the 100 mg pills in half. Call or return sooner if any problems or concerns. - sertraline (ZOLOFT) 100 MG tablet; One pill (100mg ) nightly  Dispense: 90 tablet; Refill: 1  3. Hyperlipidemia, unspecified hyperlipidemia type Currently on atorvastatin and new RX provided but will also obtain lipid panel to see if any changes needed in his current dose of atorvastatin. Patient with normal LFT's on labs done 12/19/2018.  - Lipid panel - atorvastatin (LIPITOR) 10 MG tablet; Take 1 tablet (10 mg total) by mouth daily at 6 PM.  Dispense: 90 tablet; Refill: 3  An After Visit Summary was printed and given to the patient.   Follow-up: Return in about 3 months  (around 05/16/2019) for DM/GAD.    Antony Blackbird, MD

## 2019-02-13 NOTE — Telephone Encounter (Signed)
Patient eye exam upload through [facility]

## 2019-02-13 NOTE — Progress Notes (Signed)
DM f/u and fasting labs  CBG today was 112

## 2019-02-14 LAB — BASIC METABOLIC PANEL WITH GFR
BUN/Creatinine Ratio: 9 (ref 9–20)
BUN: 9 mg/dL (ref 6–24)
CO2: 23 mmol/L (ref 20–29)
Calcium: 9.5 mg/dL (ref 8.7–10.2)
Chloride: 103 mmol/L (ref 96–106)
Creatinine, Ser: 0.97 mg/dL (ref 0.76–1.27)
GFR calc Af Amer: 111 mL/min/1.73
GFR calc non Af Amer: 96 mL/min/1.73
Glucose: 106 mg/dL — ABNORMAL HIGH (ref 65–99)
Potassium: 4.5 mmol/L (ref 3.5–5.2)
Sodium: 139 mmol/L (ref 134–144)

## 2019-02-14 LAB — LIPID PANEL
Chol/HDL Ratio: 2.2 ratio (ref 0.0–5.0)
Cholesterol, Total: 106 mg/dL (ref 100–199)
HDL: 49 mg/dL
LDL Calculated: 44 mg/dL (ref 0–99)
Triglycerides: 65 mg/dL (ref 0–149)
VLDL Cholesterol Cal: 13 mg/dL (ref 5–40)

## 2019-02-14 LAB — MICROALBUMIN / CREATININE URINE RATIO
Creatinine, Urine: 196.6 mg/dL
Microalb/Creat Ratio: 2 mg/g{creat} (ref 0–29)
Microalbumin, Urine: 3.6 ug/mL

## 2019-02-15 NOTE — Telephone Encounter (Signed)
-----   Message from Antony Blackbird, MD sent at 02/15/2019  8:40 AM EDT ----- Glucose was 106 and otherwise normal BMP. Your lipid panel was normal as well as your urine creatinine/microalbumin level

## 2019-03-25 ENCOUNTER — Encounter: Payer: Self-pay | Admitting: Family Medicine

## 2019-06-18 ENCOUNTER — Encounter: Payer: Self-pay | Admitting: Family Medicine

## 2019-06-29 ENCOUNTER — Other Ambulatory Visit: Payer: Self-pay | Admitting: Family Medicine

## 2019-06-29 DIAGNOSIS — F411 Generalized anxiety disorder: Secondary | ICD-10-CM

## 2019-07-08 ENCOUNTER — Ambulatory Visit (HOSPITAL_COMMUNITY)
Admission: EM | Admit: 2019-07-08 | Discharge: 2019-07-08 | Disposition: A | Discharge status: Home / Self Care | Payer: HRSA Program | Attending: Internal Medicine | Admitting: Internal Medicine

## 2019-07-08 ENCOUNTER — Encounter (HOSPITAL_COMMUNITY): Payer: Self-pay

## 2019-07-08 ENCOUNTER — Ambulatory Visit (INDEPENDENT_AMBULATORY_CARE_PROVIDER_SITE_OTHER): Payer: HRSA Program

## 2019-07-08 ENCOUNTER — Other Ambulatory Visit: Payer: Self-pay

## 2019-07-08 DIAGNOSIS — Z20828 Contact with and (suspected) exposure to other viral communicable diseases: Secondary | ICD-10-CM

## 2019-07-08 DIAGNOSIS — R6883 Chills (without fever): Secondary | ICD-10-CM

## 2019-07-08 DIAGNOSIS — R61 Generalized hyperhidrosis: Secondary | ICD-10-CM

## 2019-07-08 DIAGNOSIS — Z7984 Long term (current) use of oral hypoglycemic drugs: Secondary | ICD-10-CM | POA: Insufficient documentation

## 2019-07-08 DIAGNOSIS — J45909 Unspecified asthma, uncomplicated: Secondary | ICD-10-CM | POA: Diagnosis not present

## 2019-07-08 DIAGNOSIS — E785 Hyperlipidemia, unspecified: Secondary | ICD-10-CM | POA: Diagnosis not present

## 2019-07-08 DIAGNOSIS — Z79899 Other long term (current) drug therapy: Secondary | ICD-10-CM | POA: Diagnosis not present

## 2019-07-08 DIAGNOSIS — E119 Type 2 diabetes mellitus without complications: Secondary | ICD-10-CM | POA: Diagnosis not present

## 2019-07-08 DIAGNOSIS — M2550 Pain in unspecified joint: Secondary | ICD-10-CM | POA: Insufficient documentation

## 2019-07-08 DIAGNOSIS — R6889 Other general symptoms and signs: Secondary | ICD-10-CM

## 2019-07-08 DIAGNOSIS — R42 Dizziness and giddiness: Secondary | ICD-10-CM | POA: Diagnosis not present

## 2019-07-08 DIAGNOSIS — U071 COVID-19: Secondary | ICD-10-CM | POA: Diagnosis not present

## 2019-07-08 DIAGNOSIS — R55 Syncope and collapse: Secondary | ICD-10-CM | POA: Insufficient documentation

## 2019-07-08 DIAGNOSIS — M791 Myalgia, unspecified site: Secondary | ICD-10-CM | POA: Diagnosis not present

## 2019-07-08 LAB — CBC
HCT: 45.2 % (ref 39.0–52.0)
Hemoglobin: 15.6 g/dL (ref 13.0–17.0)
MCH: 32 pg (ref 26.0–34.0)
MCHC: 34.5 g/dL (ref 30.0–36.0)
MCV: 92.8 fL (ref 80.0–100.0)
Platelets: 124 10*3/uL — ABNORMAL LOW (ref 150–400)
RBC: 4.87 MIL/uL (ref 4.22–5.81)
RDW: 12.1 % (ref 11.5–15.5)
WBC: 4.2 10*3/uL (ref 4.0–10.5)
nRBC: 0 % (ref 0.0–0.2)

## 2019-07-08 LAB — BASIC METABOLIC PANEL
Anion gap: 9 (ref 5–15)
BUN: 10 mg/dL (ref 6–20)
CO2: 27 mmol/L (ref 22–32)
Calcium: 9.4 mg/dL (ref 8.9–10.3)
Chloride: 102 mmol/L (ref 98–111)
Creatinine, Ser: 1.01 mg/dL (ref 0.61–1.24)
GFR calc Af Amer: >60 mL/min (ref >60)
GFR calc non Af Amer: >60 mL/min (ref >60)
Glucose, Bld: 148 mg/dL — ABNORMAL HIGH (ref 70–99)
Potassium: 4.3 mmol/L (ref 3.5–5.1)
Sodium: 138 mmol/L (ref 135–145)

## 2019-07-08 LAB — CBG MONITORING, ED: Glucose-Capillary: 130 mg/dL — ABNORMAL HIGH (ref 70–99)

## 2019-07-08 LAB — GLUCOSE, CAPILLARY: Glucose-Capillary: 130 mg/dL — ABNORMAL HIGH (ref 70–99)

## 2019-07-08 MED ORDER — BENZONATATE 100 MG PO CAPS: 100.0000 mg | ORAL_CAPSULE | Freq: Three times a day (TID) | ORAL | 0 refills | Status: DC

## 2019-07-08 MED ORDER — ONDANSETRON HCL 4 MG PO TABS: 4.0000 mg | ORAL_TABLET | Freq: Three times a day (TID) | ORAL | 0 refills | Status: DC | PRN

## 2019-07-08 NOTE — ED Provider Notes (Signed)
[place]    CSN: [id number] Arrival date & time: 07/08/19  1135      History   Chief Complaint Chief Complaint  Patient presents with  . Chills  . Night Sweats  . Joint Pain    HPI Justin Drake is a 43 y.o. male with a history of diabetes mellitus type 2-controlled and asthma-controlled comes to urgent care with complaints of chills, night sweats and generalized body aches of 1 week duration.  Patient says symptoms started insidiously and is gotten progressively worse.  Night sweats persistent.  He started having some cough with minimal sputum production in the past day.  Denies any shortness of breath.  No fever.  His appetite has diminished as well.  His oral intake is diminished.  This morning patient had an episode of dizziness with near syncopal episode.  He came to the urgent care to be evaluated.  No sick contacts.   HPI  Past Medical History:  Diagnosis Date  . Asthma    when young  . Back pain, chronic   . DM2 (diabetes mellitus, type 2) ([place]) 11/18/2013  . Seasonal allergies     Patient Active Problem List   Diagnosis Date Noted  . Hyperlipidemia 12/28/2017  . Vitamin D deficiency 02/08/2016  . Chest pain 05/14/2015  . Healthcare maintenance 03/04/2014  . DM2 (diabetes mellitus, type 2) ([place]) 11/18/2013    Past Surgical History:  Procedure Laterality Date  . KNEE ARTHROSCOPY  april 1993 left, Oct 1994 right  . KNEE SURGERY  11/1990 05/1992   L and R knee       Home Medications    Prior to Admission medications   Medication Sig Start Date End Date Taking? Authorizing Provider  albuterol (VENTOLIN HFA) 108 (90 Base) MCG/ACT inhaler Inhale 2 puffs into the lungs every 6 (six) hours as needed for wheezing or shortness of breath. 12/25/18   Fulp, Cammie, MD  atorvastatin (LIPITOR) 10 MG tablet Take 1 tablet (10 mg total) by mouth daily at 6 PM. 02/13/19 02/08/20  Fulp, Cammie, MD  benzonatate (TESSALON) 100 MG capsule Take 1 capsule (100  mg total) by mouth every 8 (eight) hours. 07/08/19   Chase Picket, MD  Cholecalciferol 1000 units tablet Take 1 tablet (1,000 Units total) by mouth daily. 02/08/16   Milagros Loll, MD  insulin NPH Human (NOVOLIN N) 100 UNIT/ML injection Inject 0.18 mLs (18 Units total) into the skin 2 (two) times daily before a meal. 12/19/18   Fulp, Cammie, MD  metFORMIN (GLUCOPHAGE-XR) 500 MG 24 hr tablet 2 tablets (1,000 mg) once per day after the evening meal 01/28/19   Fulp, Cammie, MD  nitroGLYCERIN (NITROSTAT) 0.4 MG SL tablet Place 1 tablet (0.4 mg total) under the tongue every 5 (five) minutes as needed for chest pain. 10/10/16 02/13/19  Velna Ochs, MD  ondansetron (ZOFRAN) 4 MG tablet Take 1 tablet (4 mg total) by mouth every 8 (eight) hours as needed for nausea or vomiting. 07/08/19   Chase Picket, MD  sertraline (ZOLOFT) 100 MG tablet TAKE 1 TABLET BY MOUTH AT NIGHT 07/01/19   Fulp, Ander Gaster, MD    Family History Family History  Problem Relation Age of Onset  . Diabetes Mother   . Renal Disease Mother   . Hypertension Mother   . Kidney disease Mother        on kidney transplant list   . Diabetes Maternal Aunt   . Kidney disease Maternal Aunt   .  Diabetes Maternal Grandmother   . Diabetes Maternal Grandfather   . Kidney disease Maternal Grandfather   . Diabetes Maternal Aunt   . Stroke Other        great grand mother  . Cancer Other        great uncle  . Migraines Father     Social History Social History   Tobacco Use  . Smoking status: Never Smoker  . Smokeless tobacco: Never Used  Substance Use Topics  . Alcohol use: No    Alcohol/week: 0.0 standard drinks  . Drug use: No     Allergies   Patient has no known allergies.   Review of Systems Review of Systems  Constitutional: Positive for activity change, chills and fatigue. Negative for fever.  HENT: Negative for congestion, ear discharge and ear pain.   Respiratory: Positive for cough. Negative for chest  tightness, shortness of breath and wheezing.   Cardiovascular: Negative for chest pain.  Gastrointestinal: Positive for nausea and vomiting. Negative for abdominal pain and diarrhea.  Genitourinary: Negative.   Musculoskeletal: Positive for arthralgias and myalgias.  Skin: Negative.   Neurological: Positive for dizziness, weakness, light-headedness and headaches.  Psychiatric/Behavioral: Negative for confusion and decreased concentration.     Physical Exam Triage Vital Signs ED Triage Vitals  Enc Vitals Group     BP 07/08/19 1228 118/81     Pulse Rate 07/08/19 1228 90     Resp 07/08/19 1228 18     Temp 07/08/19 1228 99.3 F (37.4 C)     Temp Source 07/08/19 1228 Oral     SpO2 07/08/19 1228 98 %     Weight --      Height --      Head Circumference --      Peak Flow --      Pain Score 07/08/19 1227 0     Pain Loc --      Pain Edu? --      Excl. in GC? --    No data found.  Updated Vital Signs BP 118/81 (BP Location: Left Arm)   Pulse 90   Temp 99.3 F (37.4 C) (Oral)   Resp 18   SpO2 98%   Visual Acuity Right Eye Distance:   Left Eye Distance:   Bilateral Distance:    Right Eye Near:   Left Eye Near:    Bilateral Near:     Physical Exam Vitals signs and nursing note reviewed.  Constitutional:      General: He is not in acute distress.    Appearance: He is ill-appearing. He is not toxic-appearing.  HENT:     Nose: Nose normal. No congestion or rhinorrhea.     Mouth/Throat:     Mouth: Mucous membranes are moist.     Pharynx: No oropharyngeal exudate or posterior oropharyngeal erythema.  Neck:     Musculoskeletal: Normal range of motion and neck supple. No neck rigidity.  Cardiovascular:     Rate and Rhythm: Normal rate and regular rhythm.  Pulmonary:     Effort: Pulmonary effort is normal.     Breath sounds: No stridor. No wheezing or rhonchi.  Abdominal:     General: Bowel sounds are normal. There is no distension.     Palpations: Abdomen is soft.      Tenderness: There is no abdominal tenderness.     Hernia: No hernia is present.  Musculoskeletal: Normal range of motion.        General: No swelling, deformity  or signs of injury.  Skin:    General: Skin is warm.     Capillary Refill: Capillary refill takes less than 2 seconds.     Coloration: Skin is not jaundiced.     Findings: No bruising or erythema.  Neurological:     General: No focal deficit present.     Mental Status: He is alert and oriented to person, place, and time.  Psychiatric:        Mood and Affect: Mood normal.        Behavior: Behavior normal.      UC Treatments / Results  Labs (all labs ordered are listed, but only abnormal results are displayed) Labs Reviewed  GLUCOSE, CAPILLARY - Abnormal; Notable for the following components:      Result Value   Glucose-Capillary 130 (*)    All other components within normal limits  CBG MONITORING, ED - Abnormal; Notable for the following components:   Glucose-Capillary 130 (*)    All other components within normal limits  NOVEL CORONAVIRUS, NAA (HOSP ORDER, SEND-OUT TO REF LAB; TAT 18-24 HRS)  CBC  BASIC METABOLIC PANEL    EKG   Radiology Dg Chest 2 View  Result Date: 07/08/2019 CLINICAL DATA:  Cough EXAM: CHEST - 2 VIEW COMPARISON:  No prior. FINDINGS: Mediastinum hilar structures normal. Heart size normal. Low lung volumes with mild bibasilar atelectasis. No pleural effusion or pneumothorax. IMPRESSION: Low lung volumes with mild bibasilar atelectasis. Electronically Signed   By: Maisie Fus  Register   On: 07/08/2019 13:16    Procedures Procedures (including critical care time)  Medications Ordered in UC Medications - No data to display  Initial Impression / Assessment and Plan / UC Course  I have reviewed the triage vital signs and the nursing notes.  Pertinent labs & imaging results that were available during my care of the patient were reviewed by me and considered in my medical decision making (see chart  for details).     1.  Flulike illness: COVID-19 testing done Patient is advised to self isolate Patient is encouraged to push oral fluids Zofran as needed for nausea vomiting Tessalon Perles as needed for cough If patient's symptoms worsen he is advised to return to urgent care to be reevaluated CBC, BMP today. Final Clinical Impressions(s) / UC Diagnoses   Final diagnoses:  Flu-like symptoms   Discharge Instructions   None    ED Prescriptions    Medication Sig Dispense Auth. Provider   ondansetron (ZOFRAN) 4 MG tablet  (Status: Discontinued) Take 1 tablet (4 mg total) by mouth every 8 (eight) hours as needed for nausea or vomiting. 21 tablet Zunaira Lamy, Britta Mccreedy, MD   benzonatate (TESSALON) 100 MG capsule Take 1 capsule (100 mg total) by mouth every 8 (eight) hours. 30 capsule Violet Cart, Britta Mccreedy, MD   ondansetron (ZOFRAN) 4 MG tablet Take 1 tablet (4 mg total) by mouth every 8 (eight) hours as needed for nausea or vomiting. 21 tablet Jadavion Spoelstra, Britta Mccreedy, MD     PDMP not reviewed this encounter.   Merrilee Jansky, MD 07/08/19 [phone]

## 2019-07-08 NOTE — ED Triage Notes (Signed)
Pt present chills, night sweats and joint pain. Symptoms started over a week ago.

## 2019-07-09 ENCOUNTER — Encounter: Payer: Self-pay | Admitting: Family Medicine

## 2019-07-09 LAB — NOVEL CORONAVIRUS, NAA (HOSP ORDER, SEND-OUT TO REF LAB; TAT 18-24 HRS): SARS-CoV-2, NAA: DETECTED — AB

## 2019-07-10 ENCOUNTER — Encounter (HOSPITAL_COMMUNITY): Payer: Self-pay

## 2019-07-11 ENCOUNTER — Telehealth: Payer: Self-pay | Admitting: Nurse Practitioner

## 2019-07-11 NOTE — Telephone Encounter (Signed)
Called to Discuss with patient about Covid symptoms and the use of bamlanivimab, a monoclonal antibody infusion for those with mild to moderate Covid symptoms and at a high risk of hospitalization.     Pt is qualified for this infusion at the [facility] infusion center due to co-morbid conditions and/or a member of an at-risk group.     Patient declines at this time.

## 2019-07-15 NOTE — Progress Notes (Signed)
Subjective:    Patient ID: Justin Drake, male    DOB: 1976/05/30, 43 y.o.   MRN: [id number] Virtual Visit via Telephone Note  I connected with [name] on 07/15/19 at  1:30 PM EST by telephone and verified that I am speaking with the correct person using two identifiers.   Consent:  I discussed the limitations, risks, security and privacy concerns of performing an evaluation and management service by telephone and the availability of in person appointments. I also discussed with the patient that there may be a patient responsible charge related to this service. The patient expressed understanding and agreed to proceed.  Location of patient: The patient was at home Location of provider: I was in the office  Persons participating in the televisit with the patient.   No one else on the call   History of Present Illness: This is a 43 year old male primary care patient of Dr. Chapman Fitch was recently in the urgent care center on December 7 with a flulike illness.  He had actually been ill for a week previous.  He had body aches headache dizziness lightheadedness and cough.  He came to the urgent care at that point on December 7 and was tested found to be positive for Covid.  This is a follow-up visit in this regard.  Patient also has a diagnosis of type 2 diabetes and generalized anxiety We did call and offer this patient monoclonal antibody on 10 December however the patient declined this.  Today the patient states he is improving.  His blood sugars were elevated but now they are down to 150.  His illness did start December 1.  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  Overall however he is markedly better.  His appetite has improved.  Constitutional:   No  weight loss, night sweats,  Fevers, chills, fatigue, lassitude. HEENT:   No headaches,  Difficulty swallowing,  Tooth/dental problems,  Sore throat,                No sneezing, itching, ear ache, nasal congestion, post nasal  drip,   CV:  No chest pain,  Orthopnea, PND, swelling in lower extremities, anasarca, dizziness, palpitations  GI  No heartburn, indigestion, abdominal pain, nausea, vomiting, diarrhea, change in bowel habits, loss of appetite  Resp: No shortness of breath with exertion or at rest.  No excess mucus, no productive cough,  No non-productive cough,  No coughing up of blood.  No change in color of mucus.  No wheezing.  No chest wall deformity  Skin: no rash or lesions.  GU: no dysuria, change in color of urine, no urgency or frequency.  No flank pain.  MS:  No joint pain or swelling.  No decreased range of motion.  No back pain.  Psych:  No change in mood or affect. No depression or anxiety.  No memory loss.   Observations/Objective: No observations this is a telephone visit  Assessment and Plan: #1 COVID-19 infection now resolving and the patient over the next 48 hours should be out of the window of being contagious and can come out of isolation  I gave the patient supportive advice to increase his vitamin D to 5000 units a day take vitamin C 500 mg daily and zinc 50 mg daily and continue to hydrate himself  #2 type 2 diabetes under improved control: No additional changes made in the patient's insulin program    Follow Up Instructions: I did ask for  a return visit in office with the patient's primary care provider in the next 3 to 4 weeks   I discussed the assessment and treatment plan with the patient. The patient was provided an opportunity to ask questions and all were answered. The patient agreed with the plan and demonstrated an understanding of the instructions.   The patient was advised to call back or seek an in-person evaluation if the symptoms worsen or if the condition fails to improve as anticipated.  I provided of non-face-to-face time during this encounter  including  median intraservice time , review of notes, labs, imaging, medications  and explaining  diagnosis and management to the patient .    Shan Levans, MD

## 2019-07-16 ENCOUNTER — Ambulatory Visit: Payer: BLUE CROSS/BLUE SHIELD | Attending: Critical Care Medicine | Admitting: Critical Care Medicine

## 2019-07-16 ENCOUNTER — Other Ambulatory Visit: Payer: Self-pay

## 2019-07-16 ENCOUNTER — Encounter: Payer: Self-pay | Admitting: Critical Care Medicine

## 2019-07-16 DIAGNOSIS — E1165 Type 2 diabetes mellitus with hyperglycemia: Secondary | ICD-10-CM | POA: Diagnosis not present

## 2019-07-16 DIAGNOSIS — Z794 Long term (current) use of insulin: Secondary | ICD-10-CM | POA: Diagnosis not present

## 2019-07-16 DIAGNOSIS — U071 COVID-19: Secondary | ICD-10-CM | POA: Diagnosis not present

## 2019-07-16 MED ORDER — ZINC GLUCONATE 50 MG PO TABS: 50.0000 mg | ORAL_TABLET | Freq: Every day | ORAL | 2 refills | Status: AC

## 2019-07-16 MED ORDER — INSULIN NPH (HUMAN) (ISOPHANE) 100 UNIT/ML ~~LOC~~ SUSP: 18.0000 [IU] | Freq: Two times a day (BID) | SUBCUTANEOUS | 3 refills | Status: DC

## 2019-07-16 MED ORDER — VITAMIN C 500 MG PO CHEW: CHEWABLE_TABLET | ORAL | 0 refills | Status: DC

## 2019-07-16 MED ORDER — VITAMIN D 125 MCG (5000 UT) PO CAPS: 1.0000 | ORAL_CAPSULE | Freq: Every day | ORAL | 3 refills | Status: DC

## 2019-07-16 MED FILL — NovoLIN N 100 UNIT/ML SUSP: 100 | 27 days supply | Qty: 10 | Fill #0

## 2019-07-22 ENCOUNTER — Emergency Department (HOSPITAL_COMMUNITY): Payer: BLUE CROSS/BLUE SHIELD

## 2019-07-22 ENCOUNTER — Encounter (HOSPITAL_COMMUNITY): Payer: Self-pay | Admitting: Emergency Medicine

## 2019-07-22 ENCOUNTER — Ambulatory Visit: Payer: BLUE CROSS/BLUE SHIELD

## 2019-07-22 ENCOUNTER — Emergency Department (HOSPITAL_COMMUNITY)
Admission: EM | Admit: 2019-07-22 | Discharge: 2019-07-22 | Disposition: A | Discharge status: Home / Self Care | Payer: BLUE CROSS/BLUE SHIELD | Attending: Emergency Medicine | Admitting: Emergency Medicine

## 2019-07-22 ENCOUNTER — Other Ambulatory Visit: Payer: Self-pay

## 2019-07-22 DIAGNOSIS — N5082 Scrotal pain: Secondary | ICD-10-CM | POA: Insufficient documentation

## 2019-07-22 DIAGNOSIS — N201 Calculus of ureter: Secondary | ICD-10-CM | POA: Insufficient documentation

## 2019-07-22 DIAGNOSIS — E119 Type 2 diabetes mellitus without complications: Secondary | ICD-10-CM | POA: Diagnosis not present

## 2019-07-22 DIAGNOSIS — R1030 Lower abdominal pain, unspecified: Secondary | ICD-10-CM | POA: Diagnosis present

## 2019-07-22 DIAGNOSIS — Z8619 Personal history of other infectious and parasitic diseases: Secondary | ICD-10-CM | POA: Diagnosis not present

## 2019-07-22 DIAGNOSIS — J45909 Unspecified asthma, uncomplicated: Secondary | ICD-10-CM | POA: Insufficient documentation

## 2019-07-22 DIAGNOSIS — N4400 Torsion of testis, unspecified: Secondary | ICD-10-CM

## 2019-07-22 DIAGNOSIS — Z20822 Contact with and (suspected) exposure to covid-19: Secondary | ICD-10-CM

## 2019-07-22 LAB — CBC
HCT: 43.6 % (ref 39.0–52.0)
Hemoglobin: 15 g/dL (ref 13.0–17.0)
MCH: 32.3 pg (ref 26.0–34.0)
MCHC: 34.4 g/dL (ref 30.0–36.0)
MCV: 93.8 fL (ref 80.0–100.0)
Platelets: 278 10*3/uL (ref 150–400)
RBC: 4.65 MIL/uL (ref 4.22–5.81)
RDW: 11.9 % (ref 11.5–15.5)
WBC: 6.3 10*3/uL (ref 4.0–10.5)
nRBC: 0 % (ref 0.0–0.2)

## 2019-07-22 LAB — COMPREHENSIVE METABOLIC PANEL
ALT: 38 U/L (ref 0–44)
AST: 31 U/L (ref 15–41)
Albumin: 3.7 g/dL (ref 3.5–5.0)
Alkaline Phosphatase: 77 U/L (ref 38–126)
Anion gap: 10 (ref 5–15)
BUN: 9 mg/dL (ref 6–20)
CO2: 26 mmol/L (ref 22–32)
Calcium: 9.3 mg/dL (ref 8.9–10.3)
Chloride: 103 mmol/L (ref 98–111)
Creatinine, Ser: 1.01 mg/dL (ref 0.61–1.24)
GFR calc Af Amer: >60 mL/min (ref >60)
GFR calc non Af Amer: >60 mL/min (ref >60)
Glucose, Bld: 128 mg/dL — ABNORMAL HIGH (ref 70–99)
Potassium: 4.3 mmol/L (ref 3.5–5.1)
Sodium: 139 mmol/L (ref 135–145)
Total Bilirubin: 0.5 mg/dL (ref 0.3–1.2)
Total Protein: 7.4 g/dL (ref 6.5–8.1)

## 2019-07-22 LAB — URINALYSIS, ROUTINE W REFLEX MICROSCOPIC
Bacteria, UA: NONE SEEN
Bilirubin Urine: NEGATIVE
Glucose, UA: NEGATIVE mg/dL
Ketones, ur: NEGATIVE mg/dL
Leukocytes,Ua: NEGATIVE
Nitrite: NEGATIVE
Protein, ur: NEGATIVE mg/dL
Specific Gravity, Urine: 1.011 (ref 1.005–1.030)
pH: 6 (ref 5.0–8.0)

## 2019-07-22 LAB — LIPASE, BLOOD: Lipase: 41 U/L (ref 11–51)

## 2019-07-22 MED ORDER — KETOROLAC TROMETHAMINE 15 MG/ML IJ SOLN
15.0000 mg | Freq: Once | INTRAMUSCULAR | Status: AC
  Administered 2019-07-22: 15 mg via INTRAVENOUS
  Filled 2019-07-22: qty 1

## 2019-07-22 MED ORDER — SODIUM CHLORIDE 0.9% FLUSH
3.0000 mL | Freq: Once | INTRAVENOUS | Status: AC
  Administered 2019-07-22: 05:00:00 3 mL via INTRAVENOUS

## 2019-07-22 MED ORDER — TAMSULOSIN HCL 0.4 MG PO CAPS: 0.4000 mg | ORAL_CAPSULE | Freq: Every day | ORAL | 0 refills | Status: AC

## 2019-07-22 MED ORDER — FENTANYL CITRATE (PF) 100 MCG/2ML IJ SOLN
50.0000 ug | Freq: Once | INTRAMUSCULAR | Status: AC
  Administered 2019-07-22: 06:00:00 50 ug via INTRAVENOUS
  Filled 2019-07-22: qty 2

## 2019-07-22 MED ORDER — LACTATED RINGERS IV BOLUS
1000.0000 mL | Freq: Once | INTRAVENOUS | Status: AC
  Administered 2019-07-22: 1000 mL via INTRAVENOUS

## 2019-07-22 MED ORDER — HYDROCODONE-ACETAMINOPHEN 5-325 MG PO TABS: 1.0000 | ORAL_TABLET | Freq: Four times a day (QID) | ORAL | 0 refills | Status: DC | PRN

## 2019-07-22 MED ORDER — HYDROMORPHONE HCL 1 MG/ML IJ SOLN
1.0000 mg | Freq: Once | INTRAMUSCULAR | Status: AC
  Administered 2019-07-22: 07:00:00 1 mg via INTRAVENOUS
  Filled 2019-07-22: qty 1

## 2019-07-22 NOTE — ED Notes (Signed)
To ct

## 2019-07-22 NOTE — ED Notes (Signed)
EDP at the bedside updating patient.  

## 2019-07-22 NOTE — Discharge Instructions (Addendum)
You have a 4 mm right ureteral stone. Drink plenty of fluids. Get rechecked immediately if you develop swelling or firmness to your right testicle, fevers, can't urinate or new concerning symptoms.

## 2019-07-22 NOTE — ED Provider Notes (Signed)
Patient care assumed at 0700.   Quintella Reichert, MD 07/22/19 [phone]

## 2019-07-22 NOTE — ED Notes (Signed)
Patient Alert and oriented to baseline. Stable and ambulatory to baseline. Patient verbalized understanding of the discharge instructions.  Patient belongings were taken by the patient.   

## 2019-07-22 NOTE — ED Provider Notes (Signed)
Emergency Department Provider Note   I have reviewed the triage vital signs and the nursing notes.   HISTORY  Chief Complaint Abdominal Pain   HPI Justin Drake is a 43 y.o. male who presents the emergency department today with 1 hour of abdominal pain.  Patient states he woke up and had sharp severe pain in the suprapubic area of his abdomen.  Patient states that it has since moved to the right side more so.  No urinary symptoms but has had some nonbloody diarrhea.  States that he has never had anything like this before.  No sick contacts.  No nausea or vomiting.  No change in appetite.  No suspicious food intake.  Has history of diabetes is compliant with medications and blood sugars are well controlled.  No other associated or modifying symptoms.    Past Medical History:  Diagnosis Date  . Asthma    when young  . Back pain, chronic   . DM2 (diabetes mellitus, type 2) ([place]) 11/18/2013  . Seasonal allergies     Patient Active Problem List   Diagnosis Date Noted  . COVID-19 virus infection 07/16/2019  . Hyperlipidemia 12/28/2017  . Vitamin D deficiency 02/08/2016  . Chest pain 05/14/2015  . Healthcare maintenance 03/04/2014  . DM2 (diabetes mellitus, type 2) ([place]) 11/18/2013    Past Surgical History:  Procedure Laterality Date  . KNEE ARTHROSCOPY  april 1993 left, Oct 1994 right  . KNEE SURGERY  11/1990 05/1992   L and R knee    Current Outpatient Rx  . Order #: [id number] Class: Normal  . Order #: [id number] Class: Normal  . Order #: [id number] Class: Normal  . Order #: [id number] Class: Normal  . Order #: [id number] Class: Normal  . Order #: [id number] Class: Normal  . Order #: [id number] Class: Normal  . Order #: [id number] Class: Normal  . Order #: [id number] Class: Normal  . Order #: [id number] Class: Normal  . Order #: [id number] Class: Normal    Allergies Patient has no known allergies.  Family History  Problem Relation Age of Onset  . Diabetes Mother   .  Renal Disease Mother   . Hypertension Mother   . Kidney disease Mother        on kidney transplant list   . Diabetes Maternal Aunt   . Kidney disease Maternal Aunt   . Diabetes Maternal Grandmother   . Diabetes Maternal Grandfather   . Kidney disease Maternal Grandfather   . Diabetes Maternal Aunt   . Stroke Other        great grand mother  . Cancer Other        great uncle  . Migraines Father     Social History Social History   Tobacco Use  . Smoking status: Never Smoker  . Smokeless tobacco: Never Used  Substance Use Topics  . Alcohol use: No    Alcohol/week: 0.0 standard drinks  . Drug use: No    Review of Systems  All other systems negative except as documented in the HPI. All pertinent positives and negatives as reviewed in the HPI. ____________________________________________   PHYSICAL EXAM:  VITAL SIGNS: ED Triage Vitals  Enc Vitals Group     BP 07/22/19 0447 130/90     Pulse Rate 07/22/19 0447 81     Resp 07/22/19 0447 17     Temp 07/22/19 0447 97.9 F (36.6 C)     Temp Source 07/22/19 0447 Oral     SpO2 07/22/19 0447 99 %     Weight  07/22/19 0447 245 lb (111.1 kg)     Height 07/22/19 0447 5\' 9"  (1.753 m)    Constitutional: Alert and oriented. Well appearing and in no acute distress. Eyes: Conjunctivae are normal. PERRL. EOMI. Head: Atraumatic. Nose: No congestion/rhinnorhea. Mouth/Throat: Mucous membranes are moist.  Oropharynx non-erythematous. Neck: No stridor.  No meningeal signs.   Cardiovascular: Normal rate, regular rhythm. Good peripheral circulation. Grossly normal heart sounds.   Respiratory: Normal respiratory effort.  No retractions. Lungs CTAB. Gastrointestinal: Soft and RLQ ttp. No distention.  Musculoskeletal: No lower extremity tenderness nor edema. No gross deformities of extremities. Neurologic:  Normal speech and language. No gross focal neurologic deficits are appreciated.  Skin:  Skin is warm, dry and intact. No rash  noted. GU: ttp of right testicle, normal lie, no edema or erythema. Difficult to elicit cremasteric bilaterally.   ____________________________________________   LABS (all labs ordered are listed, but only abnormal results are displayed)  Labs Reviewed  LIPASE, BLOOD  COMPREHENSIVE METABOLIC PANEL  CBC  URINALYSIS, ROUTINE W REFLEX MICROSCOPIC   ____________________________________________  EKG   EKG Interpretation  Date/Time:    Ventricular Rate:    PR Interval:    QRS Duration:   QT Interval:    QTC Calculation:   R Axis:     Text Interpretation:         ____________________________________________  RADIOLOGY  No results found.  ____________________________________________   PROCEDURES  Procedure(s) performed:   Procedures   ____________________________________________   INITIAL IMPRESSION / ASSESSMENT AND PLAN / ED COURSE  Very early in the onset of his symptoms.  Obviously would consider appendicitis but it is difficult to tell this early.  We will start with labs and symptomatic care.  Will consider imaging based on labs and response to medications.  Pain still there, testicle tender, will . If negative, plan for ct stone study.   Care transferred pending [place] results and reevaluation.   Pertinent labs & imaging results that were available during my care of the patient were reviewed by me and considered in my medical decision making (see chart for details).  ____________________________________________  FINAL CLINICAL IMPRESSION(S) / ED DIAGNOSES  Final diagnoses:  None     MEDICATIONS GIVEN DURING THIS VISIT:  Medications  fentaNYL (SUBLIMAZE) injection 50 mcg (has no administration in time range)  lactated ringers bolus 1,000 mL (has no administration in time range)  sodium chloride flush (NS) 0.9 % injection 3 mL (3 mLs Intravenous Given 07/22/19 0500)     NEW OUTPATIENT MEDICATIONS STARTED DURING THIS VISIT:  New Prescriptions    No medications on file    Note:  This note was prepared with assistance of Dragon voice recognition software. Occasional wrong-word or sound-a-like substitutions may have occurred due to the inherent limitations of voice recognition software.   Bambi Fehnel, 07/24/19, MD 07/22/19 [phone]

## 2019-07-22 NOTE — ED Triage Notes (Signed)
Patient with abdominal pain that started about one hour ago.  He states that there has not been any nausea or vomiting.  No shortness of breath.  Patient is having some diarrhea.  No fever.

## 2019-07-23 LAB — NOVEL CORONAVIRUS, NAA: SARS-CoV-2, NAA: NOT DETECTED

## 2019-07-30 ENCOUNTER — Encounter (HOSPITAL_COMMUNITY): Payer: Self-pay | Admitting: Family Medicine

## 2019-07-30 ENCOUNTER — Other Ambulatory Visit: Payer: Self-pay

## 2019-07-30 ENCOUNTER — Ambulatory Visit (HOSPITAL_COMMUNITY)
Admission: EM | Admit: 2019-07-30 | Discharge: 2019-07-30 | Disposition: A | Discharge status: Home / Self Care | Payer: BLUE CROSS/BLUE SHIELD | Attending: Family Medicine | Admitting: Family Medicine

## 2019-07-30 DIAGNOSIS — N200 Calculus of kidney: Secondary | ICD-10-CM

## 2019-07-30 MED ORDER — KETOROLAC TROMETHAMINE 60 MG/2ML IM SOLN
INTRAMUSCULAR | Status: AC
  Filled 2019-07-30: qty 2

## 2019-07-30 MED ORDER — KETOROLAC TROMETHAMINE 60 MG/2ML IM SOLN
60.0000 mg | Freq: Once | INTRAMUSCULAR | Status: AC
  Administered 2019-07-30: 60 mg via INTRAMUSCULAR

## 2019-07-30 NOTE — Discharge Instructions (Addendum)
Follow-up with urology today at 2 PM at [facility] urologist Toradol given here for pain

## 2019-07-30 NOTE — ED Provider Notes (Signed)
[facility]    CSN: [id number] Arrival date & time: 07/30/19  1034      History   Chief Complaint Chief Complaint  Patient presents with  . kidney stone    HPI Justin Drake is a 43 y.o. male.   Patient is a 43 year old male with past medical history of asthma, back pain, DM 2.  He presents today with continued pain from kidney stone.  He was seen and evaluated in the ER on 07/22/2019 and was found to have  4millimeter kidney stone on the right.  He was also found to have partial testicular torsion with bilateral hydroceles and testicular cyst.  He has been taking the hydrocodone that was prescribed which helps a little bit with his pain.  He is concerned because the pain is continued.  No fevers.  ROS per HPI      Past Medical History:  Diagnosis Date  . Asthma    when young  . Back pain, chronic   . DM2 (diabetes mellitus, type 2) ([facility]) 11/18/2013  . Seasonal allergies     Patient Active Problem List   Diagnosis Date Noted  . COVID-19 virus infection 07/16/2019  . Hyperlipidemia 12/28/2017  . Vitamin D deficiency 02/08/2016  . Chest pain 05/14/2015  . Healthcare maintenance 03/04/2014  . DM2 (diabetes mellitus, type 2) ([facility]) 11/18/2013    Past Surgical History:  Procedure Laterality Date  . KNEE ARTHROSCOPY  april 1993 left, Oct 1994 right  . KNEE SURGERY  11/1990 05/1992   L and R knee       Home Medications    Prior to Admission medications   Medication Sig Start Date End Date Taking? Authorizing Provider  albuterol (VENTOLIN HFA) 108 (90 Base) MCG/ACT inhaler Inhale 2 puffs into the lungs every 6 (six) hours as needed for wheezing or shortness of breath. 12/25/18   Fulp, Cammie, MD  Ascorbic Acid (VITAMIN C) 500 MG CHEW One daily 07/16/19   Storm FriskWright, Patrick E, MD  atorvastatin (LIPITOR) 10 MG tablet Take 1 tablet (10 mg total) by mouth daily at 6 PM. 02/13/19 02/08/20  Fulp, Cammie, MD  benzonatate (TESSALON) 100 MG capsule Take 1  capsule (100 mg total) by mouth every 8 (eight) hours. 07/08/19   LampteyBritta Mccreedy, Philip O, MD  Cholecalciferol (VITAMIN D) 125 MCG (5000 UT) CAPS Take 1 capsule by mouth daily. 07/16/19   Storm FriskWright, Patrick E, MD  HYDROcodone-acetaminophen (NORCO/VICODIN) 5-325 MG tablet Take 1 tablet by mouth every 6 (six) hours as needed. 07/22/19   Tilden Fossaees, Elizabeth, MD  insulin NPH Human (NOVOLIN N) 100 UNIT/ML injection Inject 0.18 mLs (18 Units total) into the skin 2 (two) times daily before a meal. 07/16/19   Storm FriskWright, Patrick E, MD  metFORMIN (GLUCOPHAGE-XR) 500 MG 24 hr tablet 2 tablets (1,000 mg) once per day after the evening meal 01/28/19   Fulp, Cammie, MD  nitroGLYCERIN (NITROSTAT) 0.4 MG SL tablet Place 1 tablet (0.4 mg total) under the tongue every 5 (five) minutes as needed for chest pain. 10/10/16 02/13/19  Reymundo PollGuilloud, Carolyn, MD  ondansetron (ZOFRAN) 4 MG tablet Take 1 tablet (4 mg total) by mouth every 8 (eight) hours as needed for nausea or vomiting. 07/08/19   Merrilee JanskyLamptey, Philip O, MD  sertraline (ZOLOFT) 100 MG tablet TAKE 1 TABLET BY MOUTH AT NIGHT 07/01/19   Fulp, Cammie, MD  tamsulosin (FLOMAX) 0.4 MG CAPS capsule Take 1 capsule (0.4 mg total) by mouth daily. 07/22/19   Tilden Fossaees, Elizabeth, MD  zinc  gluconate 50 MG tablet Take 1 tablet (50 mg total) by mouth daily. 07/16/19   Elsie Stain, MD    Family History Family History  Problem Relation Age of Onset  . Diabetes Mother   . Renal Disease Mother   . Hypertension Mother   . Kidney disease Mother        on kidney transplant list   . Diabetes Maternal Aunt   . Kidney disease Maternal Aunt   . Diabetes Maternal Grandmother   . Diabetes Maternal Grandfather   . Kidney disease Maternal Grandfather   . Diabetes Maternal Aunt   . Stroke Other        great grand mother  . Cancer Other        great uncle  . Migraines Father     Social History Social History   Tobacco Use  . Smoking status: Never Smoker  . Smokeless tobacco: Never Used  Substance  Use Topics  . Alcohol use: No    Alcohol/week: 0.0 standard drinks  . Drug use: No     Allergies   Patient has no known allergies.   Review of Systems Review of Systems   Physical Exam Triage Vital Signs ED Triage Vitals [07/30/19 1056]  Enc Vitals Group     BP (!) 147/96     Pulse      Resp 18     Temp 98.3 F (36.8 C)     Temp Source Oral     SpO2      Weight      Height      Head Circumference      Peak Flow      Pain Score      Pain Loc      Pain Edu?      Excl. in [place]?    No data found.  Updated Vital Signs BP (!) 147/96 (BP Location: Right Arm)   Temp 98.3 F (36.8 C) (Oral)   Resp 18   Visual Acuity Right Eye Distance:   Left Eye Distance:   Bilateral Distance:    Right Eye Near:   Left Eye Near:    Bilateral Near:     Physical Exam Vitals and nursing note reviewed.  Constitutional:      Appearance: Normal appearance.  HENT:     Head: Normocephalic and atraumatic.     Nose: Nose normal.  Eyes:     Conjunctiva/sclera: Conjunctivae normal.  Pulmonary:     Effort: Pulmonary effort is normal.  Abdominal:     Palpations: Abdomen is soft.     Tenderness: There is no abdominal tenderness. There is right CVA tenderness.  Musculoskeletal:        General: Normal range of motion.     Cervical back: Normal range of motion.  Skin:    General: Skin is warm and dry.  Neurological:     Mental Status: He is alert.  Psychiatric:        Mood and Affect: Mood normal.      UC Treatments / Results  Labs (all labs ordered are listed, but only abnormal results are displayed) Labs Reviewed - No data to display  EKG   Radiology No results found.  Procedures Procedures (including critical care time)  Medications Ordered in UC Medications  ketorolac (TORADOL) injection 60 mg (has no administration in time range)    Initial Impression / Assessment and Plan / UC Course  I have reviewed the triage vital signs and the  nursing  notes.  Pertinent labs & imaging results that were available during my care of the patient were reviewed by me and considered in my medical decision making (see chart for details).     Patient with known kidney stone still having discomfort. Spoke with urology and they will be able to see him at 2 PM today. Toradol given here for pain  Final Clinical Impressions(s) / UC Diagnoses   Final diagnoses:  Kidney stone     Discharge Instructions     Follow-up with urology today at 2 PM at [facility] urologist Toradol given here for pain    ED Prescriptions    None     PDMP not reviewed this encounter.   Janace Aris, NP 07/30/19 1240

## 2019-08-15 ENCOUNTER — Encounter: Payer: Self-pay | Admitting: Family Medicine

## 2019-08-15 ENCOUNTER — Other Ambulatory Visit: Payer: Self-pay

## 2019-08-15 ENCOUNTER — Ambulatory Visit: Payer: Self-pay | Attending: Family Medicine | Admitting: Family Medicine

## 2019-08-15 DIAGNOSIS — E1165 Type 2 diabetes mellitus with hyperglycemia: Secondary | ICD-10-CM

## 2019-08-15 DIAGNOSIS — Z87442 Personal history of urinary calculi: Secondary | ICD-10-CM

## 2019-08-15 NOTE — Progress Notes (Signed)
Virtual Visit via Telephone Note  I connected with Justin Drake  on 08/15/19 at  2:30 PM EST by telephone and verified that I am speaking with the correct person using two identifiers.   I discussed the limitations, risks, security and privacy concerns of performing an evaluation and management service by telephone and the availability of in person appointments. I also discussed with the patient that there may be a patient responsible charge related to this service. The patient expressed understanding and agreed to proceed.  Patient Location: Home Provider Location: [facility] Others participating in call: none   History of Present Illness:        44 year old male, new to the practice, establishing care status post emergency department visit for kidney stone.  He reports no current back pain and no current issues with urination.  He also has history of testicular torsion per ED notes and he states that he has some mild dull discomfort in the testicular area.  He reports that he was referred to urology in follow-up of his ED visit however the urology office which he was sent did not accept his insurance therefore he has not had follow-up by urology regarding these issues.          He also has a history of type 2 diabetes for which he is currently on insulin.  He reports that his blood sugars have generally been between 90 and 140.  He believes his blood sugars are well controlled.  He does check them at home on a regular basis.  He is not sure when he last had blood work in follow-up of his diabetes.  He has occasional mild increased thirst, no current issues with urinary frequency.  He has had recent sensation of chest pain and increased heart rate and was seen at urgent care and was told that his EKG was normal.  He also sometimes feels as if he becomes fatigued, gets short of breath with activity or with prolonged laughing/talking.   Past Medical History:  Diagnosis Date  . Asthma    when young  . Back pain, chronic   . DM2 (diabetes mellitus, type 2) ([facility]) 11/18/2013  . Seasonal allergies     Past Surgical History:  Procedure Laterality Date  . KNEE ARTHROSCOPY  april 1993 left, Oct 1994 right  . KNEE SURGERY  11/1990 05/1992   L and R knee    Family History  Problem Relation Age of Onset  . Diabetes Mother   . Renal Disease Mother   . Hypertension Mother   . Kidney disease Mother        on kidney transplant list   . Diabetes Maternal Aunt   . Kidney disease Maternal Aunt   . Diabetes Maternal Grandmother   . Diabetes Maternal Grandfather   . Kidney disease Maternal Grandfather   . Diabetes Maternal Aunt   . Stroke Other        great grand mother  . Cancer Other        great uncle  . Migraines Father     Social History   Tobacco Use  . Smoking status: Never Smoker  . Smokeless tobacco: Never Used  Substance Use Topics  . Alcohol use: No    Alcohol/week: 0.0 standard drinks  . Drug use: No     No Known Allergies     Observations/Objective: No vital signs or physical exam conducted as visit was done via telephone  Assessment and Plan: 1. Uncontrolled type  2 diabetes mellitus with hyperglycemia ([place]) Per chart, patient with history of uncontrolled type 2 diabetes but patient reports that his home blood sugars now are generally well controlled.  He has been asked to schedule a follow-up appointment in the office for physical examination and lab work and follow-up of diabetes and other chronic medical issues.  He will continue his current medications.  His last hemoglobin A1c on review of chart was elevated at 9.5 on 12/19/2018.  2. History of kidney stones Review of chart, patient was seen in the emergency department on 07/22/2019 and again on 07/30/2019 due to back pain related to a 4 mm kidney stone on the right.  He also was found to have partial testicular torsion with bilateral hydroceles and testicular cyst on imaging done 07/22/2019.  He was  referred to urology however the office which she was referred did not take his insurance.  He reports no current back pain, dysuria or hematuria associated with kidney stone.  Will place new referral for urology. - Ambulatory referral to Urology  Follow Up Instructions:Return in about 4 weeks (around 09/12/2019) for DM/chronic issues, sooner if needed.    I discussed the assessment and treatment plan with the patient. The patient was provided an opportunity to ask questions and all were answered. The patient agreed with the plan and demonstrated an understanding of the instructions.   The patient was advised to call back or seek an in-person evaluation if the symptoms worsen or if the condition fails to improve as anticipated.  I provided 11 minutes of non-face-to-face time during this encounter.  Additional 10 minutes spent on review of chart, labs, prior imaging as well as completion of today's note and placement of referrals  Antony Blackbird, MD

## 2019-08-15 NOTE — Progress Notes (Signed)
Pt. Is here for hospital follow up.  Pt. Stated he feels fatigue.

## 2019-08-27 ENCOUNTER — Encounter: Payer: Self-pay | Admitting: Family Medicine

## 2019-09-11 ENCOUNTER — Other Ambulatory Visit: Payer: Self-pay

## 2019-09-11 ENCOUNTER — Ambulatory Visit (HOSPITAL_BASED_OUTPATIENT_CLINIC_OR_DEPARTMENT_OTHER): Payer: 59 | Admitting: Pharmacist

## 2019-09-11 ENCOUNTER — Encounter: Payer: Self-pay | Admitting: Family Medicine

## 2019-09-11 ENCOUNTER — Ambulatory Visit: Payer: 59 | Attending: Family Medicine | Admitting: Family Medicine

## 2019-09-11 VITALS — BP 126/85 | HR 85 | Temp 97.5°F | Resp 16 | Wt 248.6 lb

## 2019-09-11 DIAGNOSIS — E785 Hyperlipidemia, unspecified: Secondary | ICD-10-CM | POA: Diagnosis not present

## 2019-09-11 DIAGNOSIS — Z794 Long term (current) use of insulin: Secondary | ICD-10-CM | POA: Diagnosis not present

## 2019-09-11 DIAGNOSIS — J4520 Mild intermittent asthma, uncomplicated: Secondary | ICD-10-CM | POA: Diagnosis not present

## 2019-09-11 DIAGNOSIS — E119 Type 2 diabetes mellitus without complications: Secondary | ICD-10-CM

## 2019-09-11 DIAGNOSIS — Z87442 Personal history of urinary calculi: Secondary | ICD-10-CM

## 2019-09-11 DIAGNOSIS — Z23 Encounter for immunization: Secondary | ICD-10-CM | POA: Diagnosis not present

## 2019-09-11 DIAGNOSIS — E1165 Type 2 diabetes mellitus with hyperglycemia: Secondary | ICD-10-CM | POA: Diagnosis not present

## 2019-09-11 DIAGNOSIS — Z8616 Personal history of COVID-19: Secondary | ICD-10-CM

## 2019-09-11 LAB — POCT GLYCOSYLATED HEMOGLOBIN (HGB A1C): HbA1c, POC (prediabetic range): 6.3 % (ref 5.7–6.4)

## 2019-09-11 LAB — GLUCOSE, POCT (MANUAL RESULT ENTRY): POC Glucose: 176 mg/dL — AB (ref 70–99)

## 2019-09-11 MED ORDER — ATORVASTATIN CALCIUM 10 MG PO TABS: ORAL_TABLET | ORAL | 3 refills | Status: DC

## 2019-09-11 MED ORDER — ALBUTEROL SULFATE HFA 108 (90 BASE) MCG/ACT IN AERS: 2.0000 | INHALATION_SPRAY | Freq: Four times a day (QID) | RESPIRATORY_TRACT | 3 refills | Status: AC | PRN

## 2019-09-11 MED ORDER — METFORMIN HCL ER 500 MG PO TB24: ORAL_TABLET | ORAL | 5 refills | Status: DC

## 2019-09-11 MED ORDER — INSULIN NPH (HUMAN) (ISOPHANE) 100 UNIT/ML ~~LOC~~ SUSP: 15.0000 [IU] | Freq: Two times a day (BID) | SUBCUTANEOUS | 3 refills | Status: DC

## 2019-09-11 NOTE — Patient Instructions (Signed)
Diabetes Basics  Diabetes (diabetes mellitus) is a long-term (chronic) disease. It occurs when the body does not properly use sugar (glucose) that is released from food after you eat. Diabetes may be caused by one or both of these problems:  Your pancreas does not make enough of a hormone called insulin.  Your body does not react in a normal way to insulin that it makes. Insulin lets sugars (glucose) go into cells in your body. This gives you energy. If you have diabetes, sugars cannot get into cells. This causes high blood sugar (hyperglycemia). Follow these instructions at home: How is diabetes treated? You may need to take insulin or other diabetes medicines daily to keep your blood sugar in balance. Take your diabetes medicines every day as told by your doctor. List your diabetes medicines here: Diabetes medicines  Name of medicine: ______________________________ ? Amount (dose): _______________ Time (a.m./p.m.): _______________ Notes: ___________________________________  Name of medicine: ______________________________ ? Amount (dose): _______________ Time (a.m./p.m.): _______________ Notes: ___________________________________  Name of medicine: ______________________________ ? Amount (dose): _______________ Time (a.m./p.m.): _______________ Notes: ___________________________________ If you use insulin, you will learn how to give yourself insulin by injection. You may need to adjust the amount based on the food that you eat. List the types of insulin you use here: Insulin  Insulin type: ______________________________ ? Amount (dose): _______________ Time (a.m./p.m.): _______________ Notes: ___________________________________  Insulin type: ______________________________ ? Amount (dose): _______________ Time (a.m./p.m.): _______________ Notes: ___________________________________  Insulin type: ______________________________ ? Amount (dose): _______________ Time (a.m./p.m.):  _______________ Notes: ___________________________________  Insulin type: ______________________________ ? Amount (dose): _______________ Time (a.m./p.m.): _______________ Notes: ___________________________________  Insulin type: ______________________________ ? Amount (dose): _______________ Time (a.m./p.m.): _______________ Notes: ___________________________________ How do I manage my blood sugar?  Check your blood sugar levels using a blood glucose monitor as directed by your doctor. Your doctor will set treatment goals for you. Generally, you should have these blood sugar levels:  Before meals (preprandial): 80-130 mg/dL (4.4-7.2 mmol/L).  After meals (postprandial): below 180 mg/dL (10 mmol/L).  A1c level: less than 7%. Write down the times that you will check your blood sugar levels: Blood sugar checks  Time: _______________ Notes: ___________________________________  Time: _______________ Notes: ___________________________________  Time: _______________ Notes: ___________________________________  Time: _______________ Notes: ___________________________________  Time: _______________ Notes: ___________________________________  Time: _______________ Notes: ___________________________________  What do I need to know about low blood sugar? Low blood sugar is called hypoglycemia. This is when blood sugar is at or below 70 mg/dL (3.9 mmol/L). Symptoms may include:  Feeling: ? Hungry. ? Worried or nervous (anxious). ? Sweaty and clammy. ? Confused. ? Dizzy. ? Sleepy. ? Sick to your stomach (nauseous).  Having: ? A fast heartbeat. ? A headache. ? A change in your vision. ? Tingling or no feeling (numbness) around the mouth, lips, or tongue. ? Jerky movements that you cannot control (seizure).  Having trouble with: ? Moving (coordination). ? Sleeping. ? Passing out (fainting). ? Getting upset easily (irritability). Treating low blood sugar To treat low blood  sugar, eat or drink something sugary right away. If you can think clearly and swallow safely, follow the 15:15 rule:  Take 15 grams of a fast-acting carb (carbohydrate). Talk with your doctor about how much you should take.  Some fast-acting carbs are: ? Sugar tablets (glucose pills). Take 3-4 glucose pills. ? 6-8 pieces of hard candy. ? 4-6 oz (120-150 mL) of fruit juice. ? 4-6 oz (120-150 mL) of regular (not diet) soda. ? 1 Tbsp (15 mL) honey or sugar.    Check your blood sugar 15 minutes after you take the carb.  If your blood sugar is still at or below 70 mg/dL (3.9 mmol/L), take 15 grams of a carb again.  If your blood sugar does not go above 70 mg/dL (3.9 mmol/L) after 3 tries, get help right away.  After your blood sugar goes back to normal, eat a meal or a snack within 1 hour. Treating very low blood sugar If your blood sugar is at or below 54 mg/dL (3 mmol/L), you have very low blood sugar (severe hypoglycemia). This is an emergency. Do not wait to see if the symptoms will go away. Get medical help right away. Call your local emergency services (911 in the [place] Do not drive yourself to the hospital. Questions to ask your health care provider  Do I need to meet with a diabetes educator?  What equipment will I need to care for myself at home?  What diabetes medicines do I need? When should I take them?  How often do I need to check my blood sugar?  What number can I call if I have questions?  When is my next doctor's visit?  Where can I find a support group for people with diabetes? Where to find more information  American Diabetes Association: www.diabetes.org  American Association of Diabetes Educators: www.diabeteseducator.org/patient-resources Contact a doctor if:  Your blood sugar is at or above 240 mg/dL (13.3 mmol/L) for 2 days in a row.  You have been sick or have had a fever for 2 days or more, and you are not getting better.  You have any of these  problems for more than 6 hours: ? You cannot eat or drink. ? You feel sick to your stomach (nauseous). ? You throw up (vomit). ? You have watery poop (diarrhea). Get help right away if:  Your blood sugar is lower than 54 mg/dL (3 mmol/L).  You get confused.  You have trouble: ? Thinking clearly. ? Breathing. Summary  Diabetes (diabetes mellitus) is a long-term (chronic) disease. It occurs when the body does not properly use sugar (glucose) that is released from food after digestion.  Take insulin and diabetes medicines as told.  Check your blood sugar every day, as often as told.  Keep all follow-up visits as told by your doctor. This is important. This information is not intended to replace advice given to you by your health care provider. Make sure you discuss any questions you have with your health care provider. Document Revised: 04/10/2019 Document Reviewed: 10/20/2017 Elsevier Patient Education  2020 [employer]  Carbohydrate Counting for Diabetes Mellitus, Adult  Carbohydrate counting is a method of keeping track of how many carbohydrates you eat. Eating carbohydrates naturally increases the amount of sugar (glucose) in the blood. Counting how many carbohydrates you eat helps keep your blood glucose within normal limits, which helps you manage your diabetes (diabetes mellitus). It is important to know how many carbohydrates you can safely have in each meal. This is different for every person. A diet and nutrition specialist (registered dietitian) can help you make a meal plan and calculate how many carbohydrates you should have at each meal and snack. Carbohydrates are found in the following foods:  Grains, such as breads and cereals.  Dried beans and soy products.  Starchy vegetables, such as potatoes, peas, and corn.  Fruit and fruit juices.  Milk and yogurt.  Sweets and snack foods, such as cake, cookies, candy, chips, and soft drinks. How do I   count  carbohydrates? There are two ways to count carbohydrates in food. You can use either of the methods or a combination of both. Reading "Nutrition Facts" on packaged food The "Nutrition Facts" list is included on the labels of almost all packaged foods and beverages in the [place] It includes:  The serving size.  Information about nutrients in each serving, including the grams (g) of carbohydrate per serving. To use the "Nutrition Facts":  Decide how many servings you will have.  Multiply the number of servings by the number of carbohydrates per serving.  The resulting number is the total amount of carbohydrates that you will be having. Learning standard serving sizes of other foods When you eat carbohydrate foods that are not packaged or do not include "Nutrition Facts" on the label, you need to measure the servings in order to count the amount of carbohydrates:  Measure the foods that you will eat with a food scale or measuring cup, if needed.  Decide how many standard-size servings you will eat.  Multiply the number of servings by 15. Most carbohydrate-rich foods have about 15 g of carbohydrates per serving. ? For example, if you eat 8 oz (170 g) of strawberries, you will have eaten 2 servings and 30 g of carbohydrates (2 servings x 15 g = 30 g).  For foods that have more than one food mixed, such as soups and casseroles, you must count the carbohydrates in each food that is included. The following list contains standard serving sizes of common carbohydrate-rich foods. Each of these servings has about 15 g of carbohydrates:   hamburger bun or  English muffin.   oz (15 mL) syrup.   oz (14 g) jelly.  1 slice of bread.  1 six-inch tortilla.  3 oz (85 g) cooked rice or pasta.  4 oz (113 g) cooked dried beans.  4 oz (113 g) starchy vegetable, such as peas, corn, or potatoes.  4 oz (113 g) hot cereal.  4 oz (113 g) mashed potatoes or  of a large baked potato.  4 oz (113  g) canned or frozen fruit.  4 oz (120 mL) fruit juice.  4-6 crackers.  6 chicken nuggets.  6 oz (170 g) unsweetened dry cereal.  6 oz (170 g) plain fat-free yogurt or yogurt sweetened with artificial sweeteners.  8 oz (240 mL) milk.  8 oz (170 g) fresh fruit or one small piece of fruit.  24 oz (680 g) popped popcorn. Example of carbohydrate counting Sample meal  3 oz (85 g) chicken breast.  6 oz (170 g) brown rice.  4 oz (113 g) corn.  8 oz (240 mL) milk.  8 oz (170 g) strawberries with sugar-free whipped topping. Carbohydrate calculation 1. Identify the foods that contain carbohydrates: ? Rice. ? Corn. ? Milk. ? Strawberries. 2. Calculate how many servings you have of each food: ? 2 servings rice. ? 1 serving corn. ? 1 serving milk. ? 1 serving strawberries. 3. Multiply each number of servings by 15 g: ? 2 servings rice x 15 g = 30 g. ? 1 serving corn x 15 g = 15 g. ? 1 serving milk x 15 g = 15 g. ? 1 serving strawberries x 15 g = 15 g. 4. Add together all of the amounts to find the total grams of carbohydrates eaten: ? 30 g + 15 g + 15 g + 15 g = 75 g of carbohydrates total. Summary  Carbohydrate counting is a method of   keeping track of how many carbohydrates you eat.  Eating carbohydrates naturally increases the amount of sugar (glucose) in the blood.  Counting how many carbohydrates you eat helps keep your blood glucose within normal limits, which helps you manage your diabetes.  A diet and nutrition specialist (registered dietitian) can help you make a meal plan and calculate how many carbohydrates you should have at each meal and snack. This information is not intended to replace advice given to you by your health care provider. Make sure you discuss any questions you have with your health care provider. Document Revised: 02/09/2017 Document Reviewed: 12/30/2015 Elsevier Patient Education  2020 Else[employer] Inc.  

## 2019-09-11 NOTE — Progress Notes (Signed)
Patient presents for vaccination against influenza per orders of Dr. Fulp. Consent given. Counseling provided. No contraindications exists. Vaccine administered without incident.   

## 2019-09-11 NOTE — Progress Notes (Signed)
Subjective:  Patient ID: Justin Drake, male    DOB: 1976/03/09  Age: 44 y.o. MRN: [id number]  CC: Diabetes   HPI Justin Drake, 44 year old male with history of diabetes that was previously uncontrolled with hemoglobin A1c of 9.5 on Dec 19, 2018, hyperlipidemia, as well as mild reactive airways disease.  He has also had COVID-19 in mid December.  He reports that his blood sugars are well controlled and usually in the low 100s and always less than 120 fasting.  He was on 20 units of NPH twice daily but has overtime decrease this to 15 units twice daily secondary to episodes of not feeling well after taking his NPH.  He admits that he does not always eat very much for breakfast which could be a factor and not feeling well after taking his NPH.  He reports that he did have 1 episode of hypoglycemia recently when he was awakened feeling sweaty and jittery after not eating very much for dinner and then taking his NPH.        The only time that his blood pressure was elevated was when he had Covid as he did not feel like eating very much and therefore he did not take his NPH and his blood sugars were in the 300s to 400s for about a week.  He reports no additional symptoms other than some mild fatigue after his COVID-19 infection.  He denies any shortness of breath or cough, no chest congestion, no sore throat, headache or body aches.  He denies any current issues with blurred vision, no increased thirst, no urinary frequency and no numbness or tingling in his feet.  He has a history of mild reactive airway disease which has been stable.  Overall he feels well.  Past Medical History:  Diagnosis Date  . Asthma    when young  . Back pain, chronic   . DM2 (diabetes mellitus, type 2) ([place]) 11/18/2013  . Seasonal allergies     Past Surgical History:  Procedure Laterality Date  . KNEE ARTHROSCOPY  april 1993 left, Oct 1994 right  . KNEE SURGERY  11/1990 05/1992   L and R knee    Family  History  Problem Relation Age of Onset  . Diabetes Mother   . Renal Disease Mother   . Hypertension Mother   . Kidney disease Mother        on kidney transplant list   . Diabetes Maternal Aunt   . Kidney disease Maternal Aunt   . Diabetes Maternal Grandmother   . Diabetes Maternal Grandfather   . Kidney disease Maternal Grandfather   . Diabetes Maternal Aunt   . Stroke Other        great grand mother  . Cancer Other        great uncle  . Migraines Father     Social History   Tobacco Use  . Smoking status: Never Smoker  . Smokeless tobacco: Never Used  Substance Use Topics  . Alcohol use: No    Alcohol/week: 0.0 standard drinks    ROS Review of Systems  Constitutional: Positive for fatigue (mild). Negative for chills and fever.  HENT: Negative for congestion, sore throat and trouble swallowing.   Eyes: Negative for photophobia and visual disturbance.  Respiratory: Negative for cough and shortness of breath.   Cardiovascular: Negative for chest pain, palpitations and leg swelling.  Gastrointestinal: Negative for abdominal pain, blood in stool, constipation, diarrhea and nausea.  Endocrine: Negative for cold  intolerance, heat intolerance, polydipsia, polyphagia and polyuria.  Genitourinary: Negative for dysuria and frequency.  Musculoskeletal: Negative for arthralgias and back pain.  Neurological: Negative for dizziness and headaches.  Hematological: Negative for adenopathy. Does not bruise/bleed easily.  Psychiatric/Behavioral: Negative for self-injury and suicidal ideas.    Objective:   Today's Vitals: BP 126/85   Pulse 85   Temp (!) 97.5 F (36.4 C)   Resp 16   Wt 248 lb 9.6 oz (112.8 kg)   SpO2 96%   BMI 36.71 kg/m   Physical Exam Vitals and nursing note reviewed.  Constitutional:      Appearance: Normal appearance. He is obese.  Neck:     Vascular: No carotid bruit.  Cardiovascular:     Rate and Rhythm: Normal rate and regular rhythm.     Pulses:           Dorsalis pedis pulses are 2+ on the right side and 2+ on the left side.  Pulmonary:     Effort: Pulmonary effort is normal.     Breath sounds: Normal breath sounds. No wheezing.  Abdominal:     Palpations: Abdomen is soft.     Tenderness: There is no abdominal tenderness. There is no right CVA tenderness, left CVA tenderness, guarding or rebound.  Musculoskeletal:        General: No tenderness.     Cervical back: Normal range of motion and neck supple. No tenderness.     Right lower leg: No edema.     Left lower leg: No edema.     Right foot: Normal range of motion. No deformity, bunion, Charcot foot or foot drop.     Left foot: Normal range of motion. No deformity, bunion, Charcot foot or foot drop.  Feet:     Right foot:     Skin integrity: Skin integrity normal.     Toenail Condition: Right toenails are normal.     Left foot:     Skin integrity: Skin integrity normal.     Toenail Condition: Left toenails are normal.  Lymphadenopathy:     Cervical: No cervical adenopathy.  Skin:    General: Skin is warm and dry.  Neurological:     General: No focal deficit present.     Mental Status: He is alert and oriented to person, place, and time.  Psychiatric:        Mood and Affect: Mood normal.        Behavior: Behavior normal.     Assessment & Plan:  1.  Controlled type 2 diabetes without complication with long-term use of insulin Patient's hemoglobin A1c at today's visit was 6.3 indicating good control of diabetes.  His last hemoglobin A1c in May 2020 was 9.5.  He reports that he was initially on a higher dose of NPH but has now decreased back to 15 units twice daily as he felt that he was having issues with not feeling well immediately after taking the higher dose.  He currently occasionally feels as if he has a drop in energy after taking his NPH but this is often on mornings when he has not eaten much for dinner.  He has had 1 hypoglycemic episode overnight after not eating  very much for dinner.  Discussed with patient that I would like for him to follow-up with clinical pharmacist to discuss changing to a different form of insulin that may help prevent him from feeling bad.  He is also encouraged to make sure that he eats  prior to taking his NPH doses.  New prescription provided also for refill of Metformin.  Patient has had recent normal comprehensive metabolic panel on 07/22/2019, glucose was 128 random. - POCT glucose (manual entry) - POCT glycosylated hemoglobin (Hb A1C) - Amb Referral to Clinical Pharmacist - metFORMIN (GLUCOPHAGE-XR) 500 MG 24 hr tablet; 2 tablets (1,000 mg) once per day after the evening meal  Dispense: 60 tablet; Refill: 5 - insulin NPH Human (NOVOLIN N) 100 UNIT/ML injection; Inject 0.15 mLs (15 Units total) into the skin 2 (two) times daily before a meal.  Dispense: 30 mL; Refill: 3  2. Hyperlipidemia, unspecified hyperlipidemia type Refill provided of atorvastatin and patient is encouraged to continue a healthy, low-fat diet.  His last lipid panel was done 02/13/2019 with LDL at that time of 44. - atorvastatin (LIPITOR) 10 MG tablet; 1 pill daily in the evening  Dispense: 90 tablet; Refill: 3  3. Mild intermittent reactive airway disease without complication Refill provided for albuterol to use as needed for shortness of breath - albuterol (VENTOLIN HFA) 108 (90 Base) MCG/ACT inhaler; Inhale 2 puffs into the lungs every 6 (six) hours as needed for wheezing or shortness of breath.  Dispense: 18 g; Refill: 3  5.  History of kidney stones He reports that he has had no further episodes of hematuria or back pain since his last kidney stone.  He does have an upcoming appointment later this month with urology for further evaluation and treatment.  He does not have any current urinary symptoms.  6.  History of COVID-19 Patient is status post COVID-19 infection with positive test on 07/08/2019.  He reports no residual symptoms other than some mild  fatigue.  He denies any cough or shortness of breath, no headaches, no muscle aches and has no issues with his sense of taste or smell.   7.  Need for influenza immunization Patient received influenza immunization at today's visit.  See note from clinical pharmacist.  Patient received educational information regarding his immunization  Outpatient Encounter Medications as of 09/11/2019  Medication Sig  . albuterol (VENTOLIN HFA) 108 (90 Base) MCG/ACT inhaler Inhale 2 puffs into the lungs every 6 (six) hours as needed for wheezing or shortness of breath.  . Ascorbic Acid (VITAMIN C) 500 MG CHEW One daily  . atorvastatin (LIPITOR) 10 MG tablet Take 1 tablet (10 mg total) by mouth daily at 6 PM.  . benzonatate (TESSALON) 100 MG capsule Take 1 capsule (100 mg total) by mouth every 8 (eight) hours. (Patient not taking: Reported on 08/15/2019)  . Cholecalciferol (VITAMIN D) 125 MCG (5000 UT) CAPS Take 1 capsule by mouth daily.  Marland Kitchen HYDROcodone-acetaminophen (NORCO/VICODIN) 5-325 MG tablet Take 1 tablet by mouth every 6 (six) hours as needed. (Patient not taking: Reported on 09/11/2019)  . insulin NPH Human (NOVOLIN N) 100 UNIT/ML injection Inject 0.18 mLs (18 Units total) into the skin 2 (two) times daily before a meal.  . metFORMIN (GLUCOPHAGE-XR) 500 MG 24 hr tablet 2 tablets (1,000 mg) once per day after the evening meal  . nitroGLYCERIN (NITROSTAT) 0.4 MG SL tablet Place 1 tablet (0.4 mg total) under the tongue every 5 (five) minutes as needed for chest pain.  Marland Kitchen ondansetron (ZOFRAN) 4 MG tablet Take 1 tablet (4 mg total) by mouth every 8 (eight) hours as needed for nausea or vomiting. (Patient not taking: Reported on 09/11/2019)  . sertraline (ZOLOFT) 100 MG tablet TAKE 1 TABLET BY MOUTH AT NIGHT  . tamsulosin (FLOMAX) 0.4  MG CAPS capsule Take 1 capsule (0.4 mg total) by mouth daily.  Marland Kitchen zinc gluconate 50 MG tablet Take 1 tablet (50 mg total) by mouth daily.   No facility-administered encounter  medications on file as of 09/11/2019.    An After Visit Summary was printed and given to the patient.  Follow-up: Return in about 4 months (around 01/09/2020) for DM/chronic issues; 3-4 week f/u with Luke/CPP-bring glucometer and BS diary.    Cain Saupe MD

## 2019-10-02 ENCOUNTER — Ambulatory Visit: Payer: 59 | Attending: Family Medicine | Admitting: Pharmacist

## 2019-10-02 ENCOUNTER — Other Ambulatory Visit: Payer: Self-pay

## 2019-10-02 DIAGNOSIS — E1165 Type 2 diabetes mellitus with hyperglycemia: Secondary | ICD-10-CM | POA: Diagnosis not present

## 2019-10-02 DIAGNOSIS — Z794 Long term (current) use of insulin: Secondary | ICD-10-CM

## 2019-10-02 DIAGNOSIS — E119 Type 2 diabetes mellitus without complications: Secondary | ICD-10-CM

## 2019-10-02 LAB — GLUCOSE, POCT (MANUAL RESULT ENTRY): POC Glucose: 173 mg/dL — AB (ref 70–99)

## 2019-10-02 MED ORDER — TRUEPLUS PEN NEEDLES 32G X 4 MM MISC: 2 refills | Status: AC

## 2019-10-02 MED ORDER — LANTUS SOLOSTAR 100 UNIT/ML ~~LOC~~ SOPN: 15.0000 [IU] | PEN_INJECTOR | Freq: Every day | SUBCUTANEOUS | 0 refills | Status: DC

## 2019-10-02 NOTE — Progress Notes (Signed)
    S:    PCP: Dr. Jillyn Hidden  No chief complaint on file.  Patient arrives in good spirits.  Presents for diabetes evaluation, education, and management Patient was referred and last seen by Primary Care Provider on 09/11/19.    Patient reports Diabetes was diagnosed in 2015. Insulin and metformin since dx. No hx of pancreatitis. No hx or fhx of thyroid cancer.    Family/Social History:  - Fhx: DM, HTN, kidney disease, migraines, and kidney disease - Tobacco: never smoker - Alcohol: denies use   Insurance coverage/medication affordability: patient has insurance   Patient reports adherence with medications.  Current diabetes medications include: metformin XR 1000 mg daily, Novolin 15 units BID  Patient reports relative hypoglycemia. Denies readings below 70, but endorses feeling shaky with NPH insulin.  Patient reported dietary habits:  - Reports doing well overall  - Tries to limit sweets and has switched to diet or zero sugar beverages  Patient-reported exercise habits: none    Patient denies nocturia (nighttime urination).  Patient denies neuropathy (nerve pain). Patient denies visual changes. Patient reports self foot exams.    O:  173  Lab Results  Component Value Date   HGBA1C 6.3 09/11/2019   There were no vitals filed for this visit.  Lipid Panel     Component Value Date/Time   CHOL 106 02/13/2019 1009   TRIG 65 02/13/2019 1009   HDL 49 02/13/2019 1009   CHOLHDL 2.2 02/13/2019 1009   CHOLHDL 3.3 03/04/2014 1659   VLDL 47 (H) 03/04/2014 1659   LDLCALC 44 02/13/2019 1009   Home fasting blood sugars: reported. No meter with him today. 110 - 130s.  Clinical Atherosclerotic Cardiovascular Disease (ASCVD): No  The ASCVD Risk score Denman George DC Jr., et al., 2013) failed to calculate for the following reasons:   The valid total cholesterol range is 130 to 320 mg/dL   A/P: Diabetes longstanding currently controlled. Patient is able to verbalize appropriate  hypoglycemia management plan. Patient is adherent with medication. He is complaining of relative hypoglycemia with NPH insulin. Will change to Lantus qhs.  -Discontinued NPH insulin. -Start Lantus 15 units daily qhs. May titrate by 2 units q3days if FPG is >130. Max 25 units.  -Extensively discussed pathophysiology of diabetes, recommended lifestyle interventions, dietary effects on blood sugar control -Counseled on s/sx of and management of hypoglycemia -Next A1C anticipated 03/2020.   Written patient instructions provided.  Total time in face to face counseling 30 minutes.   Follow up Pharmacist Clinic Visit in 2 weeks.     Patient seen with: Fabio Neighbors, PharmD PGY1 Ambulatory Care Resident [facility]  Butch Penny, PharmD, CPP Clinical Pharmacist [facility] [phone]

## 2019-10-16 ENCOUNTER — Ambulatory Visit: Payer: 59 | Attending: Family Medicine | Admitting: Pharmacist

## 2019-10-16 ENCOUNTER — Other Ambulatory Visit: Payer: Self-pay

## 2019-10-16 DIAGNOSIS — Z794 Long term (current) use of insulin: Secondary | ICD-10-CM

## 2019-10-16 DIAGNOSIS — E119 Type 2 diabetes mellitus without complications: Secondary | ICD-10-CM | POA: Diagnosis not present

## 2019-10-16 LAB — GLUCOSE, POCT (MANUAL RESULT ENTRY): POC Glucose: 169 mg/dL — AB (ref 70–99)

## 2019-10-16 NOTE — Progress Notes (Signed)
    S:    PCP: Dr. Jillyn Hidden  No chief complaint on file.  Patient arrives in good spirits.  Presents for diabetes evaluation, education, and management Patient was referred and last seen by Primary Care Provider on 09/11/19. I saw him on 10/02/19 and changed NPH to Lantus insulin to minimize relative hypoglycemia.   Family/Social History:  - Fhx: DM, HTN, kidney disease, migraines, and kidney disease - Tobacco: never smoker - Alcohol: denies use   Insurance coverage/medication affordability: patient has insurance   Patient reports adherence with medications.  Current diabetes medications include: Lantus 17 units daily at bedtime, metformin XR 1000 mg daily  Patient denies relative hypoglycemia. Denies readings below 70.   Patient reported dietary habits:  - Reports doing well overall  - Tries to limit sweets and has switched to diet or zero sugar beverages  Patient-reported exercise habits: none    Patient denies nocturia (nighttime urination).  Patient denies neuropathy (nerve pain). Patient denies visual changes. Patient reports self foot exams.    O:  POCT: 169 - ate within the last 30 minutes  Lab Results  Component Value Date   HGBA1C 6.3 09/11/2019   There were no vitals filed for this visit.  Lipid Panel     Component Value Date/Time   CHOL 106 02/13/2019 1009   TRIG 65 02/13/2019 1009   HDL 49 02/13/2019 1009   CHOLHDL 2.2 02/13/2019 1009   CHOLHDL 3.3 03/04/2014 1659   VLDL 47 (H) 03/04/2014 1659   LDLCALC 44 02/13/2019 1009   Home fasting blood sugars: reported. No meter with him today. 110 - 130s.  Clinical Atherosclerotic Cardiovascular Disease (ASCVD): No  The ASCVD Risk score Denman George DC Jr., et al., 2013) failed to calculate for the following reasons:   The valid total cholesterol range is 130 to 320 mg/dL   A/P: Diabetes longstanding currently controlled. Patient is able to verbalize appropriate hypoglycemia management plan. Patient is adherent with  medication. Reports doing well with Lantus. Relative hypoglycemia has resolved.  -Continue Lantus 17 units daily qhs. May titrate by 2 units q3days if FPG is >130. Max 25 units.  -Extensively discussed pathophysiology of diabetes, recommended lifestyle interventions, dietary effects on blood sugar control -Counseled on s/sx of and management of hypoglycemia -Next A1C anticipated 03/2020.   Written patient instructions provided.  Total time in face to face counseling 30 minutes.   Follow up with PCP.      Butch Penny, PharmD, CPP Clinical Pharmacist [facility] [phone]

## 2020-01-05 ENCOUNTER — Other Ambulatory Visit: Payer: Self-pay | Admitting: Family Medicine

## 2020-01-05 DIAGNOSIS — Z794 Long term (current) use of insulin: Secondary | ICD-10-CM

## 2020-01-09 ENCOUNTER — Ambulatory Visit: Payer: 59 | Admitting: Family Medicine

## 2020-01-27 NOTE — Progress Notes (Addendum)
Patient ID: Justin Drake, male    DOB: 26-Oct-1975  MRN: [id number]  CC: Diabetes follow-up  Subjective: Justin Drake is a 44 y.o. male with history of diabetes type 2, chest pain, vitamin D deficiency, hyperlipidemia, and Covid-19 virus infection who presents for diabetes follow-up.  1. DIABETES TYPE 2 FOLLOW-UP: Last A1C:   Results for orders placed or performed in visit on 10/16/19  Glucose (CBG)  Result Value Ref Range   POC Glucose 169 (A) 70 - 99 mg/dl    Are you fasting today: '[x]'  Yes '[]'  No  Have you taken your anti-diabetic medications today: '[]'  Yes '[x]'  No  Med Adherence:  '[x]'  Yes    '[]'  No Medication side effects:  '[]'  Yes    '[x]'  No Home Monitoring?  '[x]'  Yes    '[]'  No  Home glucose results range: varying range,on average usually 170 after eating Diet Adherence: '[]'  Yes    '[x]'  No Exercise: '[]'  Yes    '[x]'  No  Hypoglycemic episodes?: '[]'  Yes    '[x]'  No Numbness of the feet? '[]'  Yes    '[x]'  No Retinopathy hx? '[]'  Yes    '[]'  No Last eye exam: 2020, eye exam was normal, says he has appointment next week  Comments: Last visit 09/11/2019 with Dr. Chapman Fitch.  During that encounter patient's A1c was 6.3 indicating good control of diabetes.  Patient reported he was initially on a higher dose of NPH but decreased back to 15 units twice daily because he felt that he was having issues with not feeling well immediately after taking the higher dose.  Patient reported he occasionally felt a drop in energy after taking NPH but this was often on mornings when he had not eaten much for dinner.  Patient was referred to clinical pharmacist to discuss changing to a different form of insulin that may help prevent him from feeling bad.  Patient encouraged to eat prior to taking his NPH doses.  New prescription provided for refill of Metformin.  Today patient reports he is no taking NPH. Reports he is taking Lantus 23 units once daily and feeling well. Reports he was initially on 15 units of Lantus but  was instructed to titrate insulin up by 2 units every 3 days until fasting blood glucose was at goal with a maximum being 25 units.  2. HYPERLIPIDEMIA FOLLOW-UP: Last Lipid Panel results:  HDL  Date Value Ref Range Status  02/13/2019 49 >39 mg/dL Final   Triglycerides  Date Value Ref Range Status  02/13/2019 65 0 - 149 mg/dL Final    Are you fasting today: '[x]'  Yes '[]'  No Med Adherence: '[x]'  Yes    '[]'  No Medication side effects: '[]'  Yes    '[x]'  No Muscle aches:  '[]'  Yes    '[x]'  No Diet Adherence: '[]'  Yes    '[x]'  No Comments: Last visit 09/11/2019 with Dr. Chapman Fitch.  During that encounter refill provided of Atorvastatin and patient encouraged to continue a healthy, low-fat diet.   3. AIRWAY DISEASE FOLLOW-UP: Last visit 09/11/2019 with Dr. Chapman Fitch.  During that encounter refill provided for Allbuterol to use as needed for shortness of breath.   Today reports he is no longer using Albuterol anymore. Reports he feels much better and that the last time he used the inhaler is going on 4 months ago.  4. KIDNEY STONES FOLLOW-UP: Last visit 09/11/2019 with Dr. Chapman Fitch.  During that encounter patient reported he had no further episodes of hematuria or back pain  since his last kidney stone.  Patient had an upcoming appointment with urology for further evaluation and treatment.  Patient did not have any current urinary symptoms.  Today reports he made it to urology appointment. Reports by the time he got to the appointment he had already passed the kidney stone and no further intervention was needed.   5. ANXIETY FOLLOW-UP:  Last visit 02/13/2019 with Dr. Chapman Fitch. During that encounter anxiety improved but requested to see if a higher dose would help further decrease anxiety. Zoloft increased to 100 mg once per day. Patient was instructed that if he did not tolerate dose or prefers the 50 mg then he could break the 100 mg pill in half and to return or call sooner if any problems or concerns.  Reports he is still taking  Zoloft 100 mg as prescribed for anxiety. States he feels like it is helping but not as much as it used to help. Reports sometimes he is unable to sleep at night. Reports sometimes when he becomes anxious he begins to tremor and has labored breathing. Reports no significant stressors causing anxiety. Denies thoughts of self-harm and suicidal ideation.  6. VITAMIN D DEFICIENCY:  Requests refill of medication.  Patient Active Problem List   Diagnosis Date Noted  . COVID-19 virus infection 07/16/2019  . Hyperlipidemia 12/28/2017  . Vitamin D deficiency 02/08/2016  . Chest pain 05/14/2015  . Healthcare maintenance 03/04/2014  . DM2 (diabetes mellitus, type 2) ([place]) 11/18/2013     Current Outpatient Medications on File Prior to Visit  Medication Sig Dispense Refill  . LANTUS SOLOSTAR 100 UNIT/ML Solostar Pen INJECT 15 UNITS INTO THE SKIN DAILY. MAY INCREASE BY 2 UNITS EVERY 3 DAYS UNTIL FASTING BG IS AT GOAL. MAX =25 UNITS. 15 mL 0  . albuterol (VENTOLIN HFA) 108 (90 Base) MCG/ACT inhaler Inhale 2 puffs into the lungs every 6 (six) hours as needed for wheezing or shortness of breath. 18 g 3  . Ascorbic Acid (VITAMIN C) 500 MG CHEW One daily 60 tablet 0  . atorvastatin (LIPITOR) 10 MG tablet 1 pill daily in the evening 90 tablet 3  . benzonatate (TESSALON) 100 MG capsule Take 1 capsule (100 mg total) by mouth every 8 (eight) hours. (Patient not taking: Reported on 08/15/2019) 30 capsule 0  . Cholecalciferol (VITAMIN D) 125 MCG (5000 UT) CAPS Take 1 capsule by mouth daily. 100 capsule 3  . HYDROcodone-acetaminophen (NORCO/VICODIN) 5-325 MG tablet Take 1 tablet by mouth every 6 (six) hours as needed. (Patient not taking: Reported on 09/11/2019) 8 tablet 0  . Insulin Pen Needle (TRUEPLUS PEN NEEDLES) 32G X 4 MM MISC Use as directed to inject insulin daily. 100 each 2  . metFORMIN (GLUCOPHAGE-XR) 500 MG 24 hr tablet 2 tablets (1,000 mg) once per day after the evening meal 60 tablet 5  . nitroGLYCERIN  (NITROSTAT) 0.4 MG SL tablet Place 1 tablet (0.4 mg total) under the tongue every 5 (five) minutes as needed for chest pain. 100 tablet 3  . ondansetron (ZOFRAN) 4 MG tablet Take 1 tablet (4 mg total) by mouth every 8 (eight) hours as needed for nausea or vomiting. (Patient not taking: Reported on 09/11/2019) 21 tablet 0  . sertraline (ZOLOFT) 100 MG tablet TAKE 1 TABLET BY MOUTH AT NIGHT 90 tablet 0  . tamsulosin (FLOMAX) 0.4 MG CAPS capsule Take 1 capsule (0.4 mg total) by mouth daily. 30 capsule 0  . zinc gluconate 50 MG tablet Take 1 tablet (50 mg total)  by mouth daily. 100 tablet 2   No current facility-administered medications on file prior to visit.    No Known Allergies  Social History   Socioeconomic History  . Marital status: Single    Spouse name: Not on file  . Number of children: Not on file  . Years of education: Not on file  . Highest education level: Not on file  Occupational History  . Occupation: Metallurgist: [employer]  Tobacco Use  . Smoking status: Never Smoker  . Smokeless tobacco: Never Used  Vaping Use  . Vaping Use: Never used  Substance and Sexual Activity  . Alcohol use: No    Alcohol/week: 0.0 standard drinks  . Drug use: No  . Sexual activity: Yes  Other Topics Concern  . Not on file  Social History Narrative   Art gallery manager   Social Determinants of Health   Financial Resource Strain:   . Difficulty of Paying Living Expenses:   Food Insecurity:   . Worried About Charity fundraiser in the Last Year:   . Arboriculturist in the Last Year:   Transportation Needs:   . Film/video editor (Medical):   Marland Kitchen Lack of Transportation (Non-Medical):   Physical Activity:   . Days of Exercise per Week:   . Minutes of Exercise per Session:   Stress:   . Feeling of Stress :   Social Connections:   . Frequency of Communication with Friends and Family:   . Frequency of Social Gatherings with Friends and Family:   . Attends Religious  Services:   . Active Member of Clubs or Organizations:   . Attends Archivist Meetings:   Marland Kitchen Marital Status:   Intimate Partner Violence:   . Fear of Current or Ex-Partner:   . Emotionally Abused:   Marland Kitchen Physically Abused:   . Sexually Abused:     Family History  Problem Relation Age of Onset  . Diabetes Mother   . Renal Disease Mother   . Hypertension Mother   . Kidney disease Mother        on kidney transplant list   . Diabetes Maternal Aunt   . Kidney disease Maternal Aunt   . Diabetes Maternal Grandmother   . Diabetes Maternal Grandfather   . Kidney disease Maternal Grandfather   . Diabetes Maternal Aunt   . Stroke Other        great grand mother  . Cancer Other        great uncle  . Migraines Father     Past Surgical History:  Procedure Laterality Date  . KNEE ARTHROSCOPY  april 1993 left, Oct 1994 right  . KNEE SURGERY  11/1990 05/1992   L and R knee    ROS: Review of Systems Negative except as stated above  PHYSICAL EXAM: Vitals with BMI 01/28/2020 09/11/2019 07/30/2019  Height '5\' 9"'  - -  Weight 241 lbs 248 lbs 10 oz -  BMI 67.12 - -  Systolic 458 099 833  Diastolic 88 85 96  Pulse 84 85 -  SpO2- 98%, room air  Temperature- 27F, oral  Physical Exam General appearance - alert, well appearing, and in no distress and oriented to person, place, and time Mental status - alert, oriented to person, place, and time, anxious Neck - supple, no significant adenopathy Lymphatics - no palpable lymphadenopathy, no hepatosplenomegaly Chest - clear to auscultation, no wheezes, rales or rhonchi, symmetric air entry, no tachypnea, retractions or  cyanosis Heart - normal rate, regular rhythm, normal S1, S2, no murmurs, rubs, clicks or gallops Neurological - alert, oriented, normal speech, no focal findings or movement disorder noted, neck supple without rigidity, cranial nerves II through XII intact, DTR's normal and symmetric, motor and sensory grossly normal  bilaterally, normal muscle tone, no tremors, strength 5/5, Romberg sign negative, normal gait and station Musculoskeletal - no joint tenderness, deformity or swelling, no muscular tenderness noted, full range of motion without pain Extremities - peripheral pulses normal, no pedal edema, no clubbing or cyanosis, feet normal, good pulses, normal color, temperature and sensation, monofilament sensory exam is normal in both feet, no edema, redness or tenderness in the calves or thighs Skin - normal coloration and turgor, no rashes, no suspicious skin lesions noted  Results for orders placed or performed in visit on 01/28/20  POCT glycosylated hemoglobin (Hb A1C)  Result Value Ref Range   Hemoglobin A1C 7.0 (A) 4.0 - 5.6 %   HbA1c POC (<> result, manual entry)     HbA1c, POC (prediabetic range)     HbA1c, POC (controlled diabetic range)    Glucose (CBG)  Result Value Ref Range   POC Glucose 164 (A) 70 - 99 mg/dl    ASSESSMENT AND PLAN: 1. Controlled type 2 diabetes mellitus without complication, with long-term current use of insulin ([place]): -Diabetes controlled. -CBG elevated in office and patient asymptomatic. Patient fasting and has not taken any anti-diabetic medications today.  -Hemoglobin A1C 7.0%.  -Continue Metformin and Insulin Glargine as prescribed. -To achieve an A1C goal of less than or equal to 7.0 percent, a fasting blood sugar of 80 to 130 mg/dL and a postprandial glucose (90 to 120 minutes after a meal) less than 180 mg/dL. In the event of sugars less than 60 mg/dl or greater than 400 mg/dl please notify the clinic ASAP. It is recommended that you undergo annual eye exams and annual foot exams. -Discussed the importance of healthy eating habits, low-carbohydrate diet, low-sugar diet, regular aerobic exercise (at least 150 minutes a week as tolerated) and medication compliance to achieve or maintain control of diabetes. -CMP to check kidney function, liver function, and electrolyte  balance. -CBC to check blood count. -Microalbumin / creatinine urine ratio to check kidney function. -Follow-up with primary physician in 4 months or sooner if needed. - POCT glycosylated hemoglobin (Hb A1C) - Glucose (CBG) - CMP14+EGFR - CBC With Differential - Microalbumin / creatinine urine ratio - metFORMIN (GLUCOPHAGE-XR) 500 MG 24 hr tablet; 2 tablets (1,000 mg) once per day after the evening meal  Dispense: 60 tablet; Refill: 3 - insulin glargine (LANTUS SOLOSTAR) 100 UNIT/ML Solostar Pen; INJECT 23 UNITS INTO THE SKIN DAILY.  Dispense: 15 mL; Refill: 3  2. Hyperlipidemia, unspecified hyperlipidemia type: -Continue Atorvastatin as prescribed. -Lipid panel to check cholesterol.  -Practice low-fat heart healthy diet and at least 150 minutes of moderate intensity exercise weekly as tolerated.  -Follow-up with primary physician in 4 months or sooner if needed. - Lipid panel - atorvastatin (LIPITOR) 10 MG tablet; 1 pill daily in the evening  Dispense: 90 tablet; Refill: 3  3. Mild intermittent reactive airway disease without complication: -Last visit 09/11/2019 with Dr. Chapman Fitch.  During that encounter refill provided for Allbuterol to use as needed for shortness of breath.  -Today patient reports he is no longer using Albuterol inhaler and feeling much better. Reports the last time he used inhaler was 4 months ago.  4. History of kidney stones: -Last visit 09/11/2019  with Dr. Chapman Fitch. During that encounter patient reported he had no further episodes of hematuria or back pain since his last kidney stone.  Patient had an upcoming appointment with urology for further evaluation and treatment.  -Today reports he made it to urology appointment. Reports by the time he got to the appointment he had already passed the kidney stone and no further intervention was needed.   5. GAD (generalized anxiety disorder): -Today patient reports he is still taking Zoloft 100 mg as prescribed for anxiety. States he  feels like it is helping but not as much as it used to help. Reports sometimes he is unable to sleep at night. Reports sometimes when he becomes anxious he begins to tremor and has labored breathing. Reports no significant stressors causing anxiety. Denies thoughts of self-harm and suicidal ideation. -Offered to switch patient to another SSRI anti-anxiety medication and explained common side effects. Patient declines switching medication at this time. Patient may revisit this consideration at a later time and schedule appointment with primary physician. -Continue Sertraline as prescribed for anxiety. -Follow-up with primary physician in 3 months or sooner if needed.  - sertraline (ZOLOFT) 100 MG tablet; TAKE 1 TABLET BY MOUTH AT NIGHT  Dispense: 90 tablet; Refill: 0  6. Vitamin D deficiency: -Patient requests refill of vitamin D. -Obtain Vitamin D, 25-hydroxy today to check for levels of vitamin D.  -Continue Cholecalciferol as prescribed. -Follow-up with primary physician in 3 months or sooner if needed. - Cholecalciferol (VITAMIN D) 125 MCG (5000 UT) CAPS; Take 1 capsule by mouth daily.  Dispense: 100 capsule; Refill: 0 - Vitamin D, 25-hydroxy  Behavioral Health Resources:   What if I or someone I know is in crisis?  . If you are thinking about harming yourself or having thoughts of suicide, or if you know someone who is, seek help right away.  . Call your doctor or mental health care provider.  . Call 911 or go to a hospital emergency room to get immediate help, or ask a friend or family member to help you do these things.  . Call the [place] National Suicide Prevention Lifeline's toll-free, 24-hour hotline at 1-800-273-TALK 5127027263) or TTY: 1-800-799-4 TTY 3178380005) to talk to a trained counselor.  . If you are in crisis, make sure you are not left alone.   . If someone else is in crisis, make sure he or she is not left alone   24 Hour :   [place] National Suicide Hotline:  952-235-9535  Therapeutic Alternative Mobile Crisis: 574-291-2785   [facility]  [address] [place], [place] [address]  [phone] or [phone]  [facility] [employer] (Domestic Violence, Rape & Victim Assistance)  [phone]  [place]  [address],   [address]   [phone] or [phone]   [place]: [phone] (8am-4pm) or [phone] (after hours)           [facility], [address] [place], [place] Fax: [phone] [email or web address]  *Interpreters available *Accepts Medicaid, Medicare, uninsured  [facility]   Mon-Fri: 8am-5pm [address] [place], [place] [phone](phone); [phone]) [email or web address]  *Accepts Medicare  [facility] [place], Fri: 8am-4pm [address], [place], [place] (phone); [phone] (fax) TaskTown.es  *Accepts Medicare  [facility] Mon-Fri: 9am-5pm  [address], [place], [place] (phone); [phone]  [email or web address]  *Accepts Medicaid  [facility] [address] [place], [place]  [email or web address].[email or web address]   [facility] [place], 8:30am-12pm/1pm-2:30pm [address] [place], [place] (phone); [phone] (fax) www.[email or web address]  *Accepts Medicaid, sliding-scale*Bilingual services available  [facility] Mon-Fri, 8am-7pm [place], [place]  [phone](phone); [phone]) www.[email or web address]  *Accepts Medicaid *Bilingual services available  [facility] Mon-Fri: 8am-5pm, Saturday by  appointment only [place], [place], [place] (phone); [phone] (fax) [email or web address]   [facility] [address] [place], [place], [place] [email or web address]  *Free & reduced services for uninsured and underinsured individuals *Bilingual services for Spanish-speaking clients 21 and under  [facility], [address] [place], [place]); [phone]) RunningConvention.[email or web address]  *Bring your own interpreter at first visit *Accepts Medicare and [facility]  [place] Mon-Fri: 9am-5:30pm [address] [place], [place], [place] (phone), [phone] (fax) After hours crisis line: [phone] www.[email or web address]  *Accepts Medicare and Medicaid  [facility], 8am-6pm [address] [place], [place] (phone); [phone] (fax) http://[email or web address]  *Subsidized costs available  [facility] Mon-Fri: 8am-4pm [address], [place], [place] [phone](phone); [phone]) www.p[email or web address]  *Accepts Medicaid  [facility] Same day access hours: Mon-Fri, 8:30-3pm Crisis hours: Mon-Fri, 8am-5pm [place], [place] Same day access hours: Mon-Fri, 8:30-3pm Crisis hours: Mon-Fri, 8am-8pm [address] [place], [place] (phone); [phone] (fax) www.[email or web address]  *Accepts Medicaid and Medicare  [facility], [place], Fri: 9am-9pm Tues, Thurs: 9am-6pm [place], [place], [place] (phone); [phone] (fax) [email or web address]://[email or web address]  *(Accepts Medicare and Medicaid; payment plans available)*Bilingual services available  [facility] [address], [place],  [place] (phone); [phone] (fax) [email or web address] [address], [place], [place], [place]  [email or web address]  *Bilingual services available  [facility]) Mon-Thurs: 8am-8pm [address], [place], [place], [place] (phone); [phone] (fax) [email or web address]  *Accepts Medicaid*Bilingual services available  [facility] [address], [place], [place], [place] (phone) [email or web address]  *Accepts Medicaid *Bilingual services available  [facility] Mon-Fri, 9am-4:45pm [address], [place], [place] (phone); [phone] (fax) [email or web address]  *Accepts Medicare  [facility] Mon-Thurs: 8:30-8pm, Fri: 8:30am-7pm [address] [place], [place] (3rd floor) [phone] (phone); [phone] (fax) VIPinterview.[email or web address]  *Accepts Medicaid; income-based reduced rates available  [facility] Mon-Fri: 8am-5pm [address] [place], [place], [place] (phone); [phone] (fax) [email or web address]://[email or web address]  *Accepts Medicaid*Bilingual services available  [place], [place], [place], [place] (phone); [phone] (fax) www.[email or web address]  *Free emergency housing and clinical services for youth in crisis  [facility]  [address], [place] [phone] www.[email or web address]  *Provides direct services to individuals in recovery from mental illness, including support groups, recovery skills classes, and one on one peer support  [employer] on [address]) [place] helpline: (940) 119-2987  https://namiguilford.org  *A community hub for information relating to local resources and services for the friends and  families of individuals living alongside a mental health condition, as well as the individuals themselves. Classes and support groups also provided    Patient was given the opportunity to ask questions.  Patient verbalized understanding of the plan and was able to repeat key elements of the plan. Patient was given clear instructions to go to Emergency Department  or return to medical center if symptoms don't improve, worsen, or new problems develop.The patient verbalized understanding.   Camillia Herter, NP

## 2020-01-28 ENCOUNTER — Encounter: Payer: Self-pay | Admitting: Family

## 2020-01-28 ENCOUNTER — Ambulatory Visit: Payer: 59 | Attending: Family Medicine | Admitting: Family

## 2020-01-28 ENCOUNTER — Other Ambulatory Visit: Payer: Self-pay

## 2020-01-28 VITALS — BP 138/88 | HR 84 | Temp 98.0°F | Resp 16 | Ht 69.0 in | Wt 241.0 lb

## 2020-01-28 DIAGNOSIS — J4520 Mild intermittent asthma, uncomplicated: Secondary | ICD-10-CM

## 2020-01-28 DIAGNOSIS — Z87442 Personal history of urinary calculi: Secondary | ICD-10-CM

## 2020-01-28 DIAGNOSIS — E785 Hyperlipidemia, unspecified: Secondary | ICD-10-CM

## 2020-01-28 DIAGNOSIS — E559 Vitamin D deficiency, unspecified: Secondary | ICD-10-CM

## 2020-01-28 DIAGNOSIS — Z794 Long term (current) use of insulin: Secondary | ICD-10-CM

## 2020-01-28 DIAGNOSIS — E119 Type 2 diabetes mellitus without complications: Secondary | ICD-10-CM

## 2020-01-28 DIAGNOSIS — F411 Generalized anxiety disorder: Secondary | ICD-10-CM

## 2020-01-28 LAB — GLUCOSE, POCT (MANUAL RESULT ENTRY): POC Glucose: 164 mg/dl — AB (ref 70–99)

## 2020-01-28 LAB — POCT GLYCOSYLATED HEMOGLOBIN (HGB A1C): Hemoglobin A1C: 7 % — AB (ref 4.0–5.6)

## 2020-01-28 MED ORDER — METFORMIN HCL ER 500 MG PO TB24: ORAL_TABLET | ORAL | 3 refills | Status: AC

## 2020-01-28 MED ORDER — LANTUS SOLOSTAR 100 UNIT/ML ~~LOC~~ SOPN: PEN_INJECTOR | SUBCUTANEOUS | 3 refills | Status: DC

## 2020-01-28 MED ORDER — VITAMIN D 125 MCG (5000 UT) PO CAPS: 1.0000 | ORAL_CAPSULE | Freq: Every day | ORAL | 0 refills | Status: AC

## 2020-01-28 MED ORDER — ATORVASTATIN CALCIUM 10 MG PO TABS: ORAL_TABLET | ORAL | 3 refills | Status: AC

## 2020-01-28 MED ORDER — SERTRALINE HCL 100 MG PO TABS: ORAL_TABLET | ORAL | 0 refills | Status: AC

## 2020-01-28 NOTE — Patient Instructions (Addendum)
Continue Lantus and Metformin for diabetes. Continue Atorvastatin for cholesterol. Follow-up with primary physician in 4 months or sooner if needed. Diabetes Basics  Diabetes (diabetes mellitus) is a long-term (chronic) disease. It occurs when the body does not properly use sugar (glucose) that is released from food after you eat. Diabetes may be caused by one or both of these problems:  Your pancreas does not make enough of a hormone called insulin.  Your body does not react in a normal way to insulin that it makes. Insulin lets sugars (glucose) go into cells in your body. This gives you energy. If you have diabetes, sugars cannot get into cells. This causes high blood sugar (hyperglycemia). Follow these instructions at home: How is diabetes treated? You may need to take insulin or other diabetes medicines daily to keep your blood sugar in balance. Take your diabetes medicines every day as told by your doctor. List your diabetes medicines here: Diabetes medicines  Name of medicine: ______________________________ ? Amount (dose): _______________ Time (a.m./p.m.): _______________ Notes: ___________________________________  Name of medicine: ______________________________ ? Amount (dose): _______________ Time (a.m./p.m.): _______________ Notes: ___________________________________  Name of medicine: ______________________________ ? Amount (dose): _______________ Time (a.m./p.m.): _______________ Notes: ___________________________________ If you use insulin, you will learn how to give yourself insulin by injection. You may need to adjust the amount based on the food that you eat. List the types of insulin you use here: Insulin  Insulin type: ______________________________ ? Amount (dose): _______________ Time (a.m./p.m.): _______________ Notes: ___________________________________  Insulin type: ______________________________ ? Amount (dose): _______________ Time (a.m./p.m.): _______________  Notes: ___________________________________  Insulin type: ______________________________ ? Amount (dose): _______________ Time (a.m./p.m.): _______________ Notes: ___________________________________  Insulin type: ______________________________ ? Amount (dose): _______________ Time (a.m./p.m.): _______________ Notes: ___________________________________  Insulin type: ______________________________ ? Amount (dose): _______________ Time (a.m./p.m.): _______________ Notes: ___________________________________ How do I manage my blood sugar?  Check your blood sugar levels using a blood glucose monitor as directed by your doctor. Your doctor will set treatment goals for you. Generally, you should have these blood sugar levels:  Before meals (preprandial): 80-130 mg/dL (5.0-2.7 mmol/L).  After meals (postprandial): below 180 mg/dL (10 mmol/L).  A1c level: less than 7%. Write down the times that you will check your blood sugar levels: Blood sugar checks  Time: _______________ Notes: ___________________________________  Time: _______________ Notes: ___________________________________  Time: _______________ Notes: ___________________________________  Time: _______________ Notes: ___________________________________  Time: _______________ Notes: ___________________________________  Time: _______________ Notes: ___________________________________  What do I need to know about low blood sugar? Low blood sugar is called hypoglycemia. This is when blood sugar is at or below 70 mg/dL (3.9 mmol/L). Symptoms may include:  Feeling: ? Hungry. ? Worried or nervous (anxious). ? Sweaty and clammy. ? Confused. ? Dizzy. ? Sleepy. ? Sick to your stomach (nauseous).  Having: ? A fast heartbeat. ? A headache. ? A change in your vision. ? Tingling or no feeling (numbness) around the mouth, lips, or tongue. ? Jerky movements that you cannot control (seizure).  Having trouble with: ? Moving  (coordination). ? Sleeping. ? Passing out (fainting). ? Getting upset easily (irritability). Treating low blood sugar To treat low blood sugar, eat or drink something sugary right away. If you can think clearly and swallow safely, follow the 15:15 rule:  Take 15 grams of a fast-acting carb (carbohydrate). Talk with your doctor about how much you should take.  Some fast-acting carbs are: ? Sugar tablets (glucose pills). Take 3-4 glucose pills. ? 6-8 pieces of hard candy. ? 4-6 oz (120-150 mL) of  fruit juice. ? 4-6 oz (120-150 mL) of regular (not diet) soda. ? 1 Tbsp (15 mL) honey or sugar.  Check your blood sugar 15 minutes after you take the carb.  If your blood sugar is still at or below 70 mg/dL (3.9 mmol/L), take 15 grams of a carb again.  If your blood sugar does not go above 70 mg/dL (3.9 mmol/L) after 3 tries, get help right away.  After your blood sugar goes back to normal, eat a meal or a snack within 1 hour. Treating very low blood sugar If your blood sugar is at or below 54 mg/dL (3 mmol/L), you have very low blood sugar (severe hypoglycemia). This is an emergency. Do not wait to see if the symptoms will go away. Get medical help right away. Call your local emergency services (911 in the [place] Do not drive yourself to the hospital. Questions to ask your health care provider  Do I need to meet with a diabetes educator?  What equipment will I need to care for myself at home?  What diabetes medicines do I need? When should I take them?  How often do I need to check my blood sugar?  What number can I call if I have questions?  When is my next doctor's visit?  Where can I find a support group for people with diabetes? Where to find more information  American Diabetes Association: www.diabetes.org  American Association of Diabetes Educators: www.diabeteseducator.org/patient-resources Contact a doctor if:  Your blood sugar is at or above 240 mg/dL (17.5 mmol/L) for  2 days in a row.  You have been sick or have had a fever for 2 days or more, and you are not getting better.  You have any of these problems for more than 6 hours: ? You cannot eat or drink. ? You feel sick to your stomach (nauseous). ? You throw up (vomit). ? You have watery poop (diarrhea). Get help right away if:  Your blood sugar is lower than 54 mg/dL (3 mmol/L).  You get confused.  You have trouble: ? Thinking clearly. ? Breathing. Summary  Diabetes (diabetes mellitus) is a long-term (chronic) disease. It occurs when the body does not properly use sugar (glucose) that is released from food after digestion.  Take insulin and diabetes medicines as told.  Check your blood sugar every day, as often as told.  Keep all follow-up visits as told by your doctor. This is important. This information is not intended to replace advice given to you by your health care provider. Make sure you discuss any questions you have with your health care provider. Document Revised: 04/10/2019 Document Reviewed: 10/20/2017 Elsevier Patient Education  2020 [employer].

## 2020-01-28 NOTE — Addendum Note (Signed)
Addended by: Rema Fendt on: 01/28/2020 10:57 AM   Modules accepted: Orders

## 2020-01-29 LAB — CBC WITH DIFFERENTIAL
Basophils Absolute: 0.1 10*3/uL (ref 0.0–0.2)
Basos: 2 %
EOS (ABSOLUTE): 0.2 10*3/uL (ref 0.0–0.4)
Eos: 4 %
Hematocrit: 44.3 % (ref 37.5–51.0)
Hemoglobin: 15 g/dL (ref 13.0–17.7)
Immature Grans (Abs): 0 10*3/uL (ref 0.0–0.1)
Immature Granulocytes: 0 %
Lymphocytes Absolute: 2 10*3/uL (ref 0.7–3.1)
Lymphs: 53 %
MCH: 31.3 pg (ref 26.6–33.0)
MCHC: 33.9 g/dL (ref 31.5–35.7)
MCV: 92 fL (ref 79–97)
Monocytes Absolute: 0.4 10*3/uL (ref 0.1–0.9)
Monocytes: 10 %
Neutrophils Absolute: 1.2 10*3/uL — ABNORMAL LOW (ref 1.4–7.0)
Neutrophils: 31 %
RBC: 4.8 x10E6/uL (ref 4.14–5.80)
RDW: 13 % (ref 11.6–15.4)
WBC: 3.8 10*3/uL (ref 3.4–10.8)

## 2020-01-29 LAB — CMP14+EGFR
ALT: 17 IU/L (ref 0–44)
AST: 14 IU/L (ref 0–40)
Albumin/Globulin Ratio: 1.7 (ref 1.2–2.2)
Albumin: 4.6 g/dL (ref 4.0–5.0)
Alkaline Phosphatase: 98 IU/L (ref 48–121)
BUN/Creatinine Ratio: 11 (ref 9–20)
BUN: 11 mg/dL (ref 6–24)
Bilirubin Total: 0.2 mg/dL (ref 0.0–1.2)
CO2: 24 mmol/L (ref 20–29)
Calcium: 9.7 mg/dL (ref 8.7–10.2)
Chloride: 104 mmol/L (ref 96–106)
Creatinine, Ser: 0.97 mg/dL (ref 0.76–1.27)
GFR calc Af Amer: 110 mL/min/{1.73_m2} (ref >59)
GFR calc non Af Amer: 95 mL/min/{1.73_m2} (ref >59)
Globulin, Total: 2.7 g/dL (ref 1.5–4.5)
Glucose: 159 mg/dL — ABNORMAL HIGH (ref 65–99)
Potassium: 4.5 mmol/L (ref 3.5–5.2)
Sodium: 142 mmol/L (ref 134–144)
Total Protein: 7.3 g/dL (ref 6.0–8.5)

## 2020-01-29 LAB — LIPID PANEL
Chol/HDL Ratio: 2.9 ratio (ref 0.0–5.0)
Cholesterol, Total: 149 mg/dL (ref 100–199)
HDL: 52 mg/dL (ref >39)
LDL Chol Calc (NIH): 76 mg/dL (ref 0–99)
Triglycerides: 116 mg/dL (ref 0–149)
VLDL Cholesterol Cal: 21 mg/dL (ref 5–40)

## 2020-01-30 NOTE — Progress Notes (Signed)
Please call patient with update.   Blood sugar and A1C discussed in clinic.  Kidney function normal.   Liver function normal.  Cholesterol normal. Continue Atorvastatin as prescribed.   Vitamin D pending.   No anemia.   Follow-up with primary physician in 4 months as scheduled.

## 2020-02-25 IMAGING — CT CT RENAL STONE PROTOCOL
2 of 4 series · 17 of 46 positions shown, 19 images · non-contrast
Comparison: None.

CLINICAL DATA: Right-sided pelvic pain

EXAM:
CT ABDOMEN AND PELVIS WITHOUT CONTRAST
TECHNIQUE: Multidetector CT imaging of the abdomen and pelvis was performed
following the standard protocol without IV contrast.

[Series 3: stone study 5.0 i30f 2 · axial · 0.93mm/px · z∈[+830,+1274]mm · 14 of 97 slices shown, 16 images]
[im 4/97  soft-tissue]
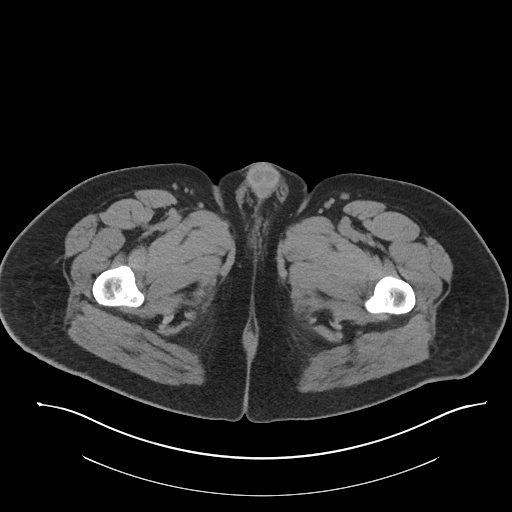
[im 4/97  bone]
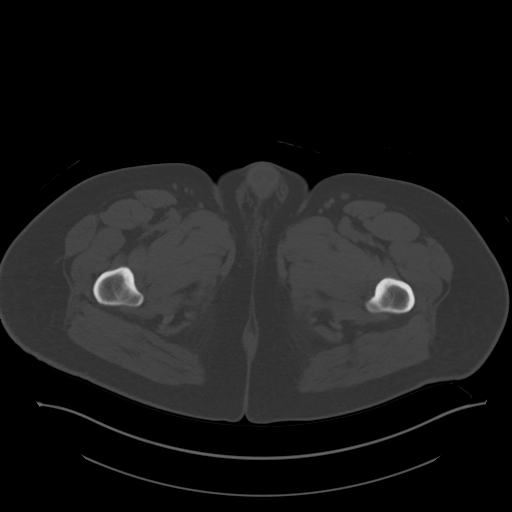
[im 12/97  soft-tissue]
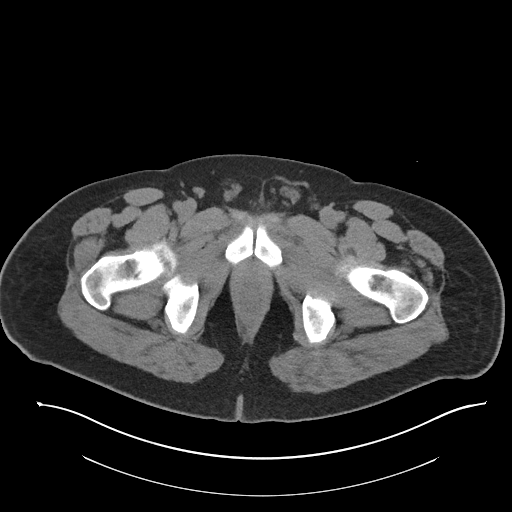
[im 19/97  soft-tissue]
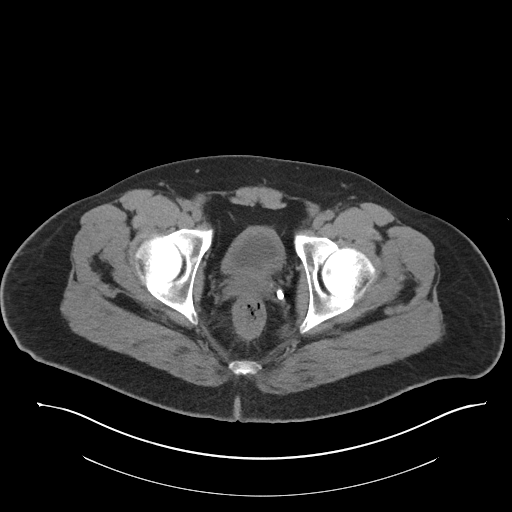
[im 26/97  soft-tissue]
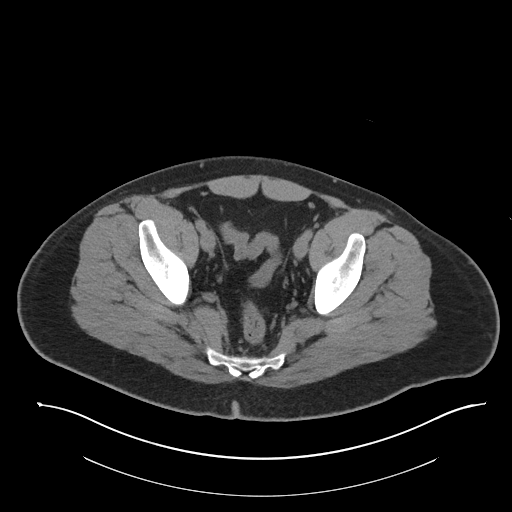
[im 34/97  soft-tissue]
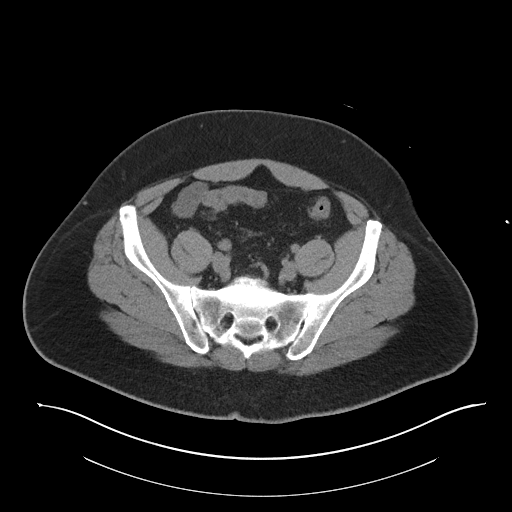
[im 37/97  soft-tissue]
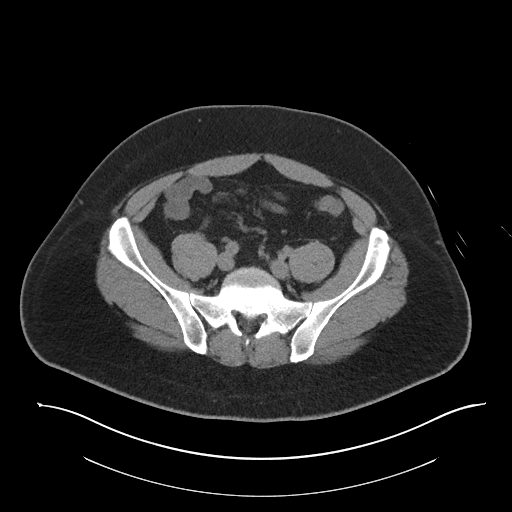
[im 45/97  soft-tissue]
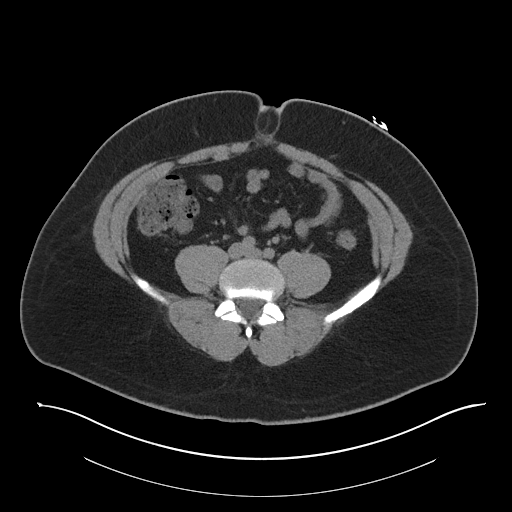
[im 52/97  soft-tissue]
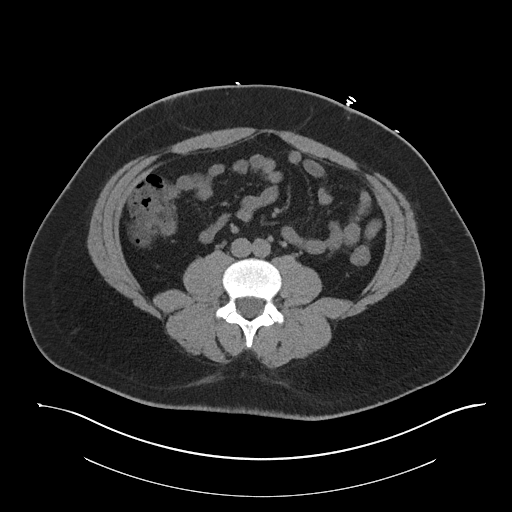
[im 60/97  soft-tissue]
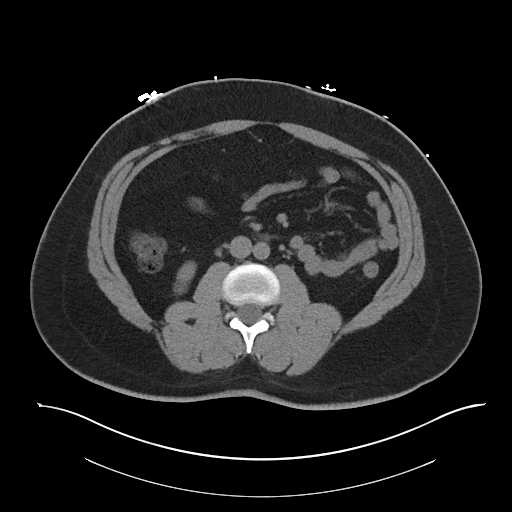
[im 60/97  bone]
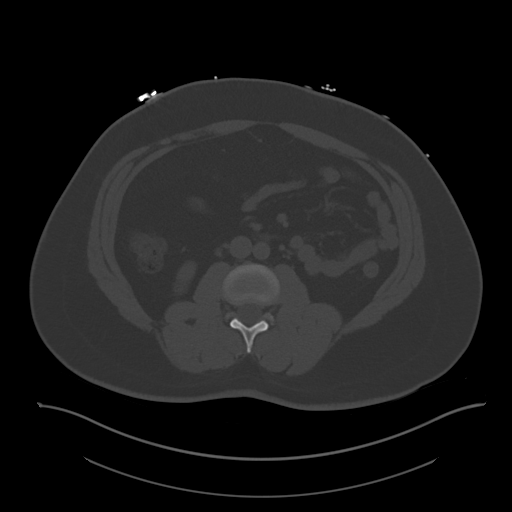
[im 63/97  soft-tissue]
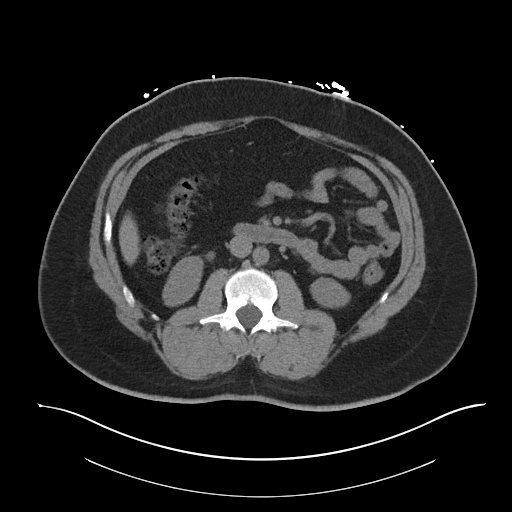
[im 71/97  soft-tissue]
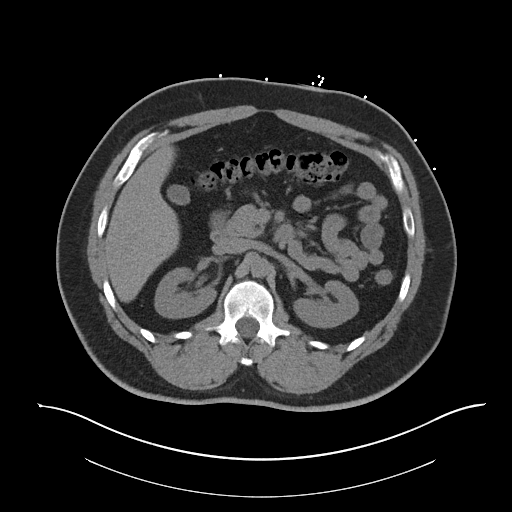
[im 78/97  soft-tissue]
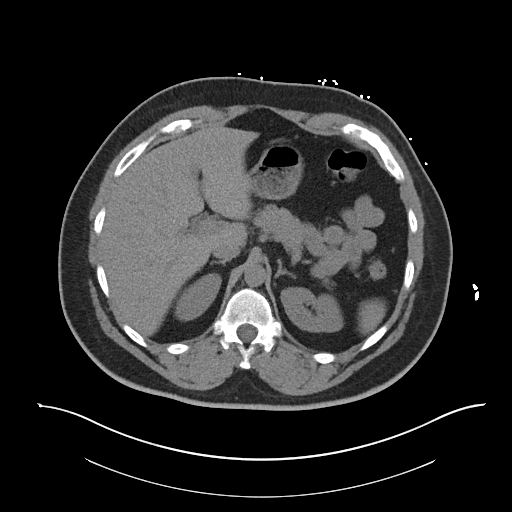
[im 85/97  soft-tissue]
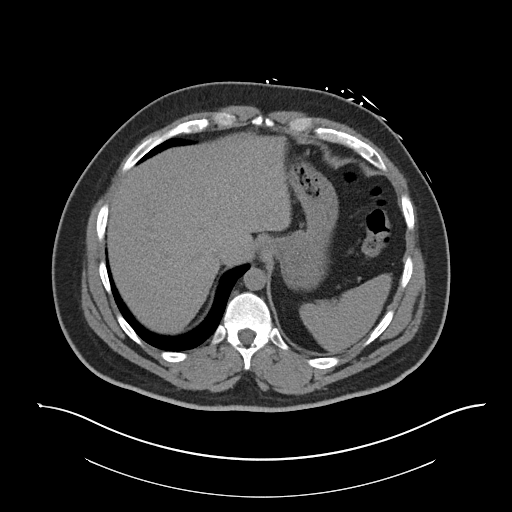
[im 93/97  soft-tissue]
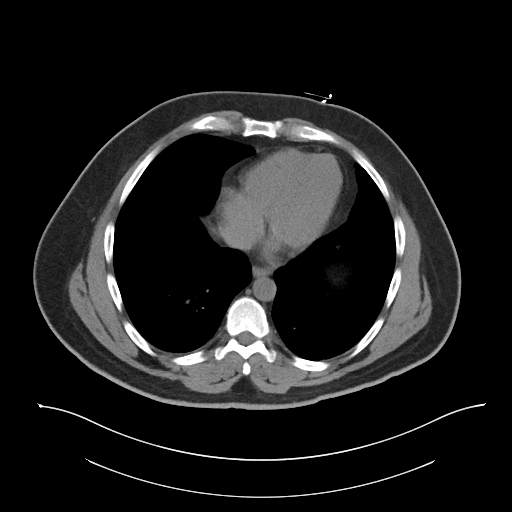

[Series 6: coronal soft tissue · coronal · 0.87mm/px · 3 of 106 slices shown]
[im 36/106  soft-tissue]
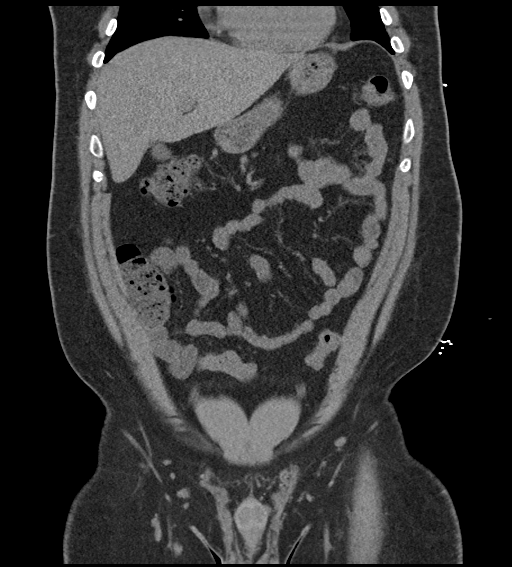
[im 47/106  soft-tissue]
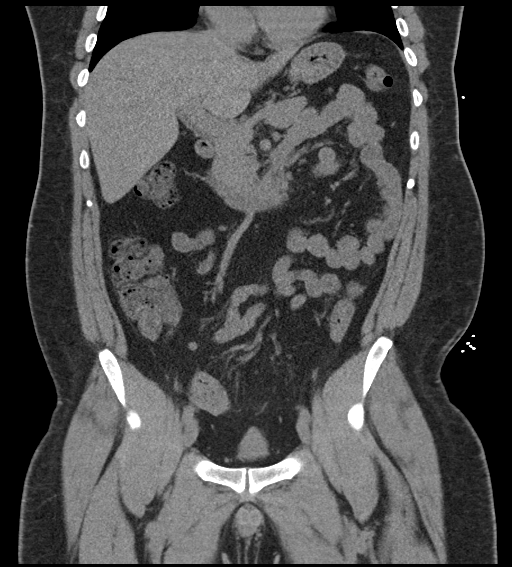
[im 59/106  soft-tissue]
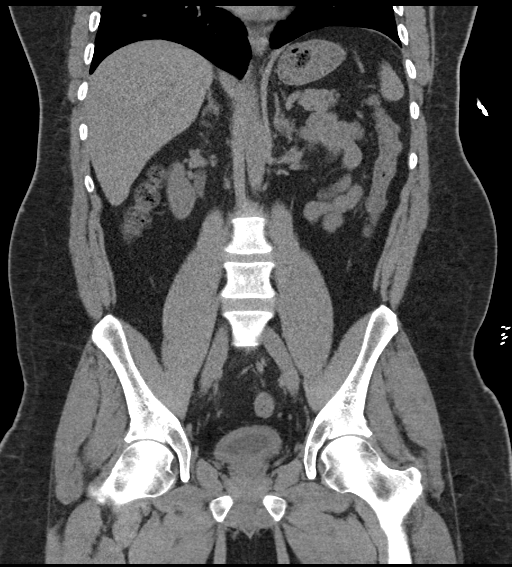

[17 of 46 positions shown; findings below may reference images not displayed]

FINDINGS: Lower chest: History of VPPVR-UK positivity. Mild patchy airspace
disease is seen at the lung bases.

Hepatobiliary: No focal liver abnormality.No evidence of biliary
obstruction or stone.

Pancreas: Unremarkable.

Spleen: Unremarkable.

Adrenals/Urinary Tract: Negative adrenals. Mild right hydronephrosis
and upper hydroureter due to a 4 mm stone at the ureter just beyond
the iliac crossing. Unremarkable bladder.

Stomach/Bowel:  No obstruction. No appendicitis.

Vascular/Lymphatic: No acute vascular abnormality. No mass or
adenopathy.

Reproductive:No pathologic findings.

Other: No ascites or pneumoperitoneum.  Small fatty umbilical hernia

Musculoskeletal: No acute abnormalities.
IMPRESSION: 1. 4 mm right ureteral stone just beyond the iliac crossing with
mild hydroureteronephrosis.
2. Mild bilateral lower lobe pneumonia in this patient with VPPVR-UK
positivity.
3. Fatty umbilical hernia.

## 2020-02-25 IMAGING — US US SCROTUM W/ DOPPLER COMPLETE
1 series · 13 of 25 positions shown · non-contrast
Comparison: No prior.

CLINICAL DATA: Testicular pain.  Evaluate for torsion.

EXAM:
SCROTAL ULTRASOUND
DOPPLER ULTRASOUND OF THE TESTICLES
TECHNIQUE: Complete ultrasound examination of the testicles, epididymis, and
other scrotal structures was performed. Color and spectral Doppler
ultrasound were also utilized to evaluate blood flow to the
testicles.

[Series 1: us scrotum w/ doppler complete · 13 of 39 slices shown]
[im 1/39]
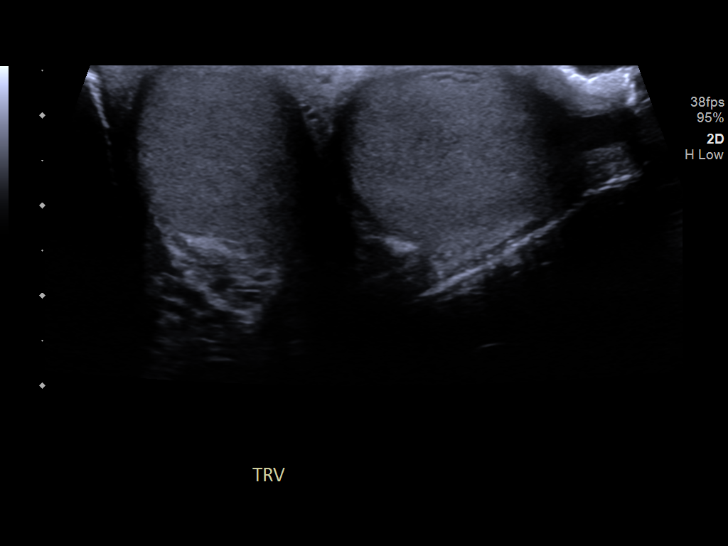
[im 4/39]
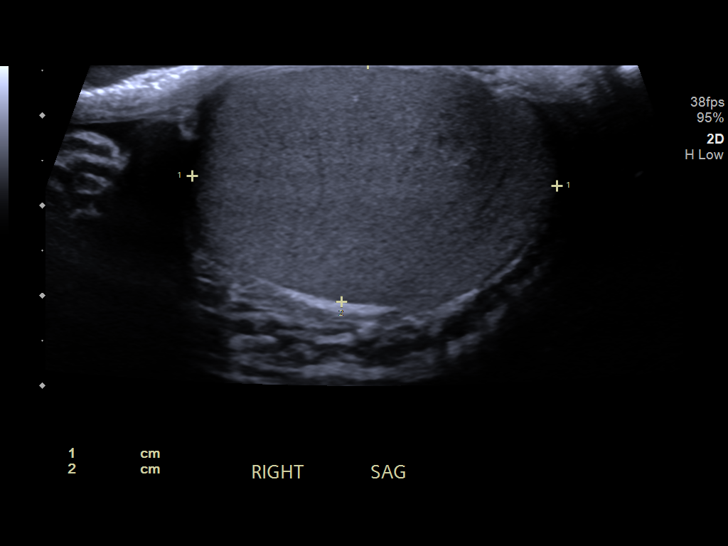
[im 7/39]
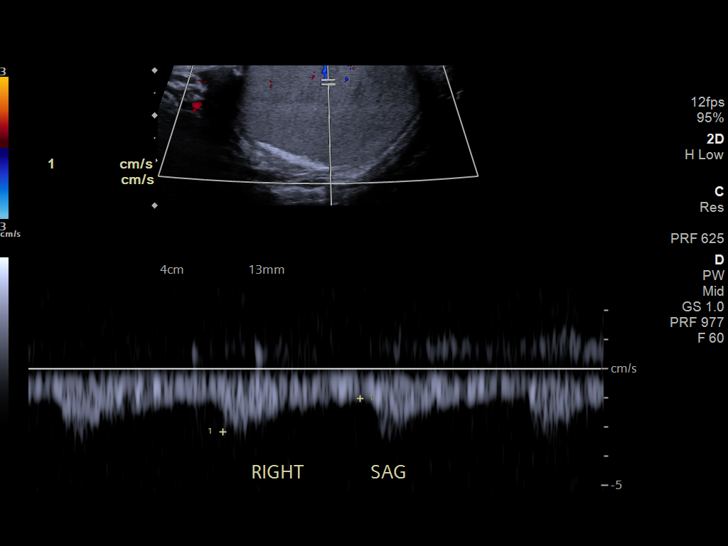
[im 10/39]
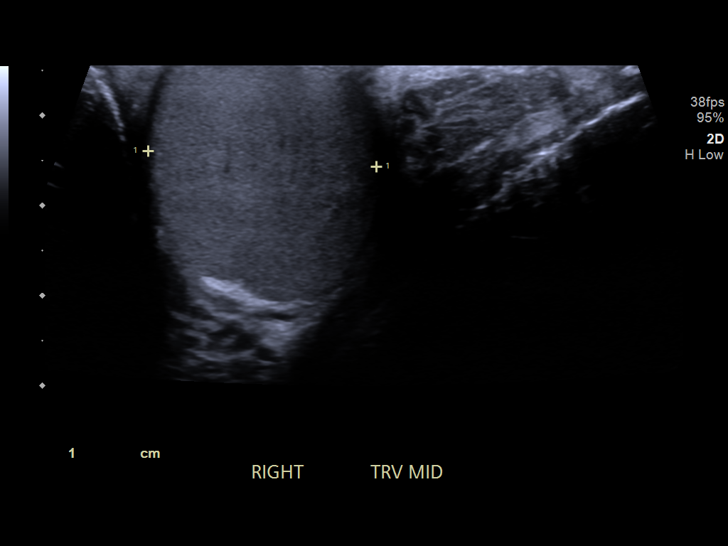
[im 13/39]
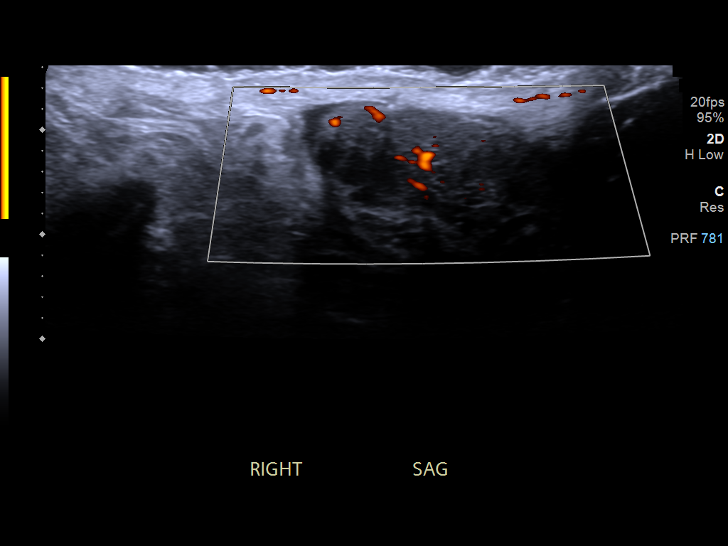
[im 16/39]
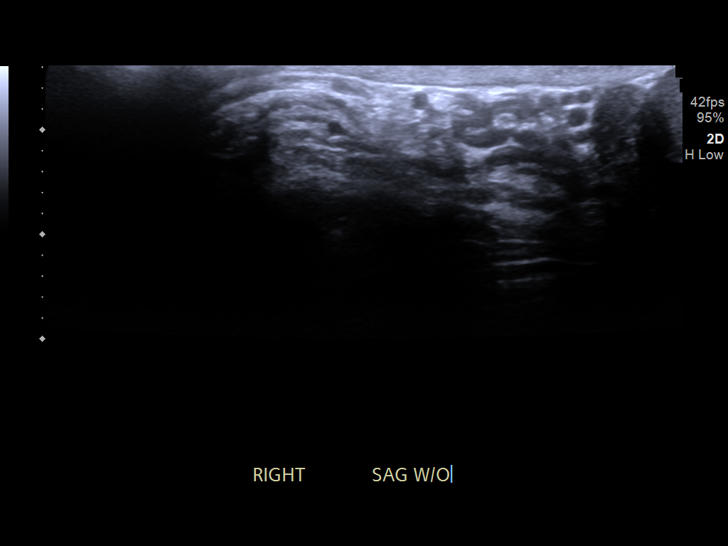
[im 20/39]
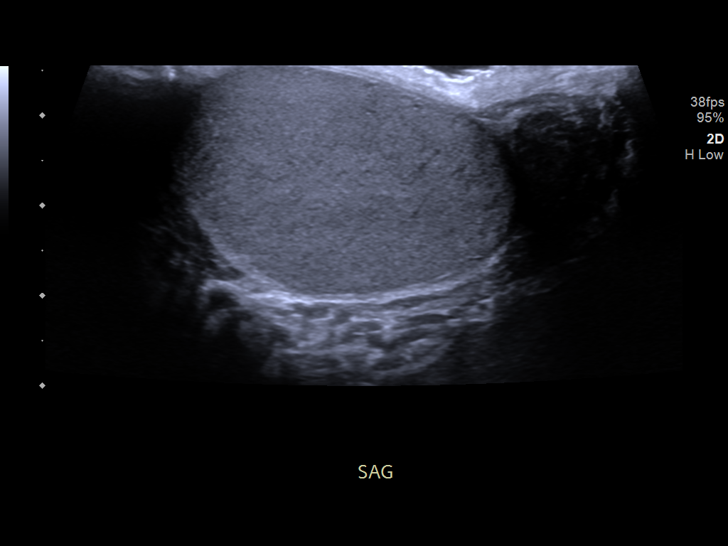
[im 23/39]
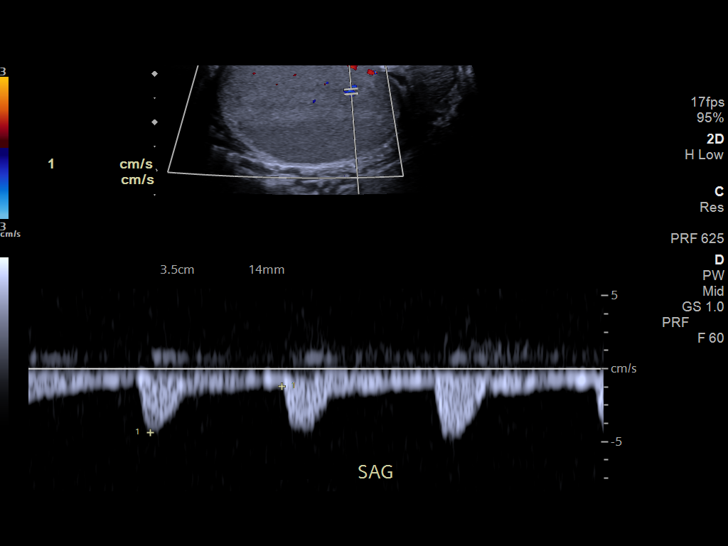
[im 26/39]
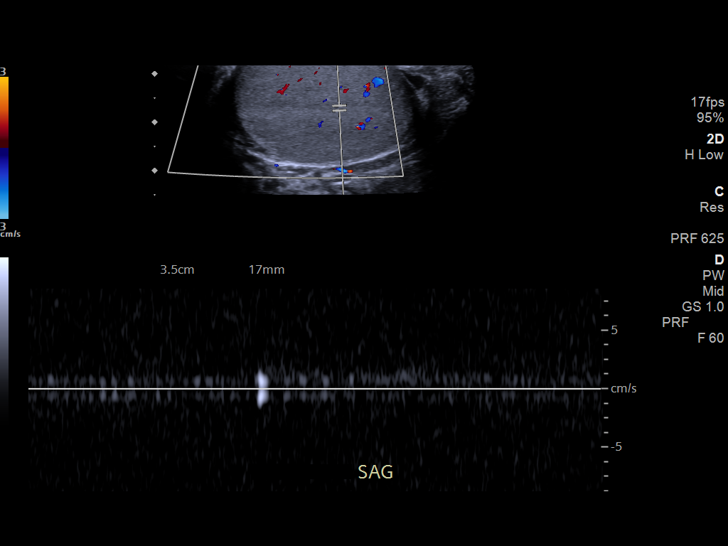
[im 29/39]
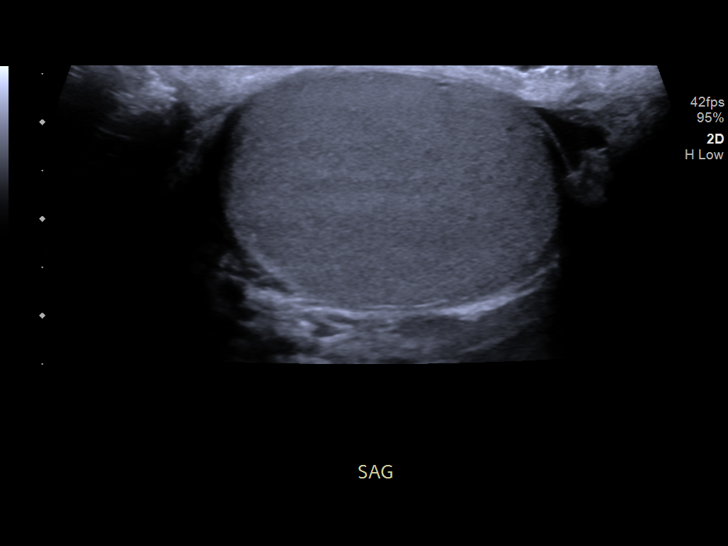
[im 32/39]
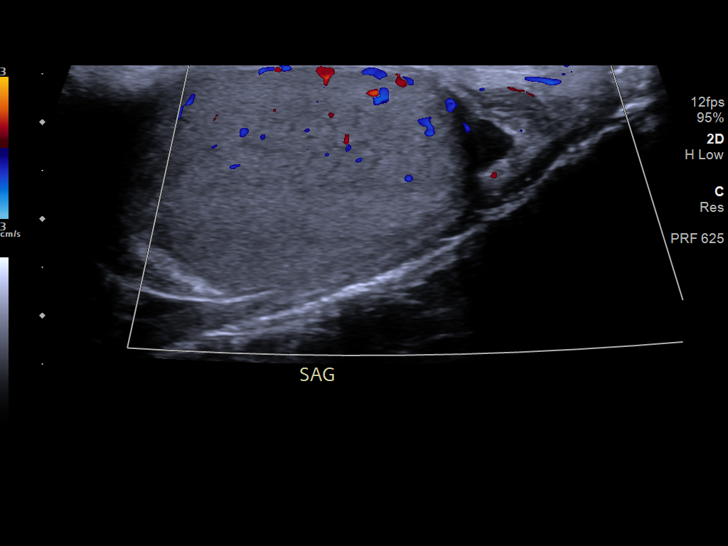
[im 35/39]
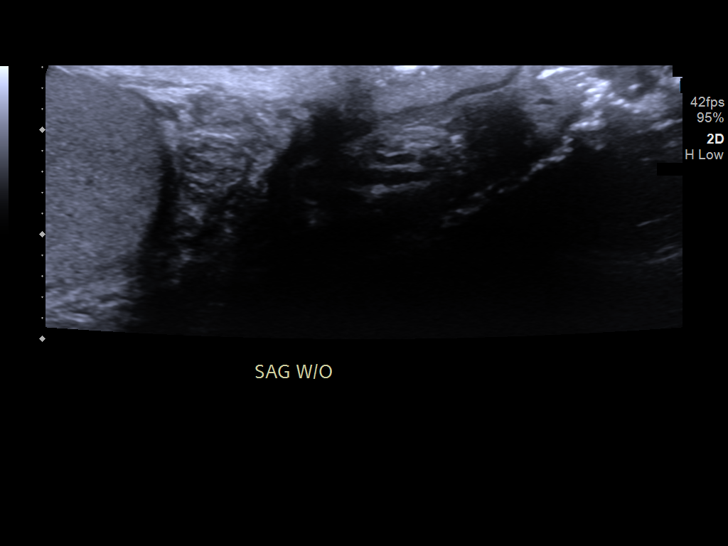
[im 39/39]
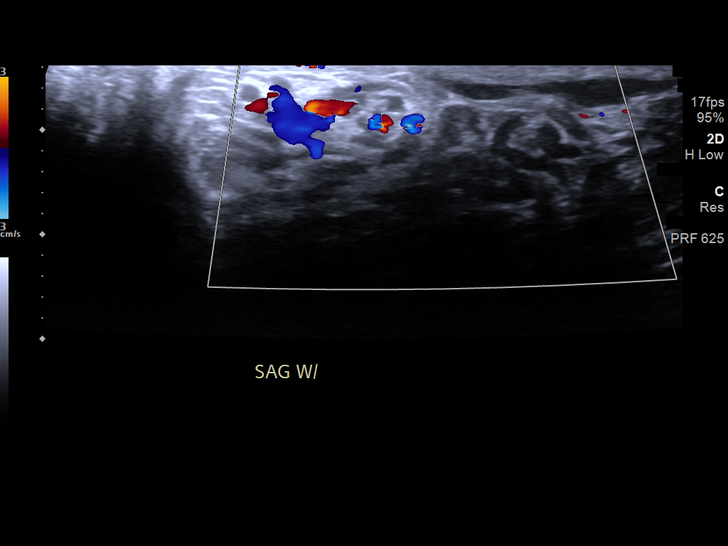

[13 of 25 positions shown; findings below may reference images not displayed]

FINDINGS: Right testicle

Measurements: 4.1 x 2.7 x 2.5 cm. No mass or microlithiasis
visualized.

Left testicle

Measurements: 3.8 x 2.5 x 2.8 cm. Tiny 3 mm cyst is noted. No mass
or microlithiasis visualized.

Right epididymis:  Normal in size and appearance.

Left epididymis:  Normal in size and appearance.

Hydrocele:  Small bilateral hydroceles.

Varicocele:  None visualized.

Pulsed Doppler interrogation of both testes demonstrates no definite
venous waveforms on the right. Normal color flow and arterial
waveforms noted on the right. Left testicular color flow Doppler
exam appears normal.
IMPRESSION: 1. No definite right testicular venous waveform noted. Arterial
waveform on the right are normal. Partial right testicular torsion
could present in this fashion and urologic consultation suggested.
Left testicular color flow Doppler normal.

2.  3 6 mm simple cyst in the left testicle.

3.  Small bilateral hydroceles.

## 2020-04-21 ENCOUNTER — Other Ambulatory Visit: Payer: Self-pay | Admitting: Pharmacist

## 2020-04-21 DIAGNOSIS — Z794 Long term (current) use of insulin: Secondary | ICD-10-CM

## 2020-04-21 LAB — HM DIABETES EYE EXAM

## 2020-04-21 MED ORDER — LANTUS SOLOSTAR 100 UNIT/ML ~~LOC~~ SOPN: PEN_INJECTOR | SUBCUTANEOUS | 3 refills | Status: AC

## 2020-06-01 ENCOUNTER — Ambulatory Visit: Payer: 59 | Admitting: Family Medicine

## 2020-06-17 ENCOUNTER — Ambulatory Visit: Payer: 59 | Admitting: Family Medicine

## 2020-10-28 ENCOUNTER — Ambulatory Visit (HOSPITAL_COMMUNITY)
Admission: EM | Admit: 2020-10-28 | Discharge: 2020-10-28 | Disposition: A | Discharge status: Home / Self Care | Payer: 59 | Attending: Family Medicine | Admitting: Family Medicine

## 2020-10-28 ENCOUNTER — Other Ambulatory Visit: Payer: Self-pay

## 2020-10-28 ENCOUNTER — Encounter (HOSPITAL_COMMUNITY): Payer: Self-pay | Admitting: Emergency Medicine

## 2020-10-28 DIAGNOSIS — J3089 Other allergic rhinitis: Secondary | ICD-10-CM

## 2020-10-28 MED ORDER — PROMETHAZINE-DM 6.25-15 MG/5ML PO SYRP
5.0000 mL | ORAL_SOLUTION | Freq: Four times a day (QID) | ORAL | 0 refills | Status: AC | PRN
Start: 2020-10-28 — End: ?

## 2020-10-28 NOTE — Discharge Instructions (Signed)
Take your Allegra and Flonase every single day that you have at home.  Try the Flonase twice daily for better relief.

## 2020-10-28 NOTE — ED Triage Notes (Signed)
Patient presents to [facility] for assessment of sore throat 1.5 ago with hoarseness.  Resolved after appox 3.5 weeks and turned into a tickle/scratch in his throat.  Over the past 1.5 weeks patient has begun to have cough, nasal drainage, sinus pressure, central chest pain when he coughs hard(denies at this time).

## 2020-10-29 NOTE — ED Provider Notes (Signed)
[facility]    CSN: [id number] Arrival date & time: 10/28/20  1924      History   Chief Complaint Chief Complaint  Patient presents with  . Sore Throat  . Cough    HPI Justin Drake is a 45 y.o. male.   Patient here today with acute on chronic scratchy throat, cough, nasal drainage, sinus pressure, chest tightness with coughing.  States this has come and gone numerous times over the past 1.5 months.  Denies fever, chills, body aches, chest pain, shortness of breath, domino pain, nausea from diarrhea, new sick contacts.  He is not aware of any history of seasonal allergies or asthma.  Has been trying numerous over-the-counter medications such as Sudafed, Mucinex, DayQuil, NyQuil with only mild temporary relief.     Past Medical History:  Diagnosis Date  . Asthma    when young  . Back pain, chronic   . DM2 (diabetes mellitus, type 2) (HCC) 11/18/2013  . Seasonal allergies     Patient Active Problem List   Diagnosis Date Noted  . COVID-19 virus infection 07/16/2019  . Hyperlipidemia 12/28/2017  . Vitamin D deficiency 02/08/2016  . Chest pain 05/14/2015  . Healthcare maintenance 03/04/2014  . DM2 (diabetes mellitus, type 2) (HCC) 11/18/2013    Past Surgical History:  Procedure Laterality Date  . KNEE ARTHROSCOPY  april 1993 left, Oct 1994 right  . KNEE SURGERY  11/1990 05/1992   L and R knee       Home Medications    Prior to Admission medications   Medication Sig Start Date End Date Taking? Authorizing Provider  promethazine-dextromethorphan (PROMETHAZINE-DM) 6.25-15 MG/5ML syrup Take 5 mLs by mouth 4 (four) times daily as needed for cough. 10/28/20  Yes Particia Nearing, PA-C  albuterol (VENTOLIN HFA) 108 (90 Base) MCG/ACT inhaler Inhale 2 puffs into the lungs every 6 (six) hours as needed for wheezing or shortness of breath. 09/11/19   Fulp, Cammie, MD  Ascorbic Acid (VITAMIN C) 500 MG CHEW One daily Patient not taking: Reported on  01/28/2020 07/16/19   Storm Frisk, MD  atorvastatin (LIPITOR) 10 MG tablet 1 pill daily in the evening 01/28/20   Rema Fendt, NP  benzonatate (TESSALON) 100 MG capsule Take 1 capsule (100 mg total) by mouth every 8 (eight) hours. Patient not taking: No sig reported 07/08/19   Merrilee Jansky, MD  Cholecalciferol (VITAMIN D) 125 MCG (5000 UT) CAPS Take 1 capsule by mouth daily. 01/28/20   Rema Fendt, NP  HYDROcodone-acetaminophen (NORCO/VICODIN) 5-325 MG tablet Take 1 tablet by mouth every 6 (six) hours as needed. Patient not taking: Reported on 09/11/2019 07/22/19   Tilden Fossa, MD  insulin glargine (LANTUS SOLOSTAR) 100 UNIT/ML Solostar Pen INJECT 23 UNITS INTO THE SKIN DAILY. 04/21/20   Fulp, Cammie, MD  Insulin Pen Needle (TRUEPLUS PEN NEEDLES) 32G X 4 MM MISC Use as directed to inject insulin daily. 10/02/19   Fulp, Cammie, MD  metFORMIN (GLUCOPHAGE-XR) 500 MG 24 hr tablet 2 tablets (1,000 mg) once per day after the evening meal 01/28/20   Rema Fendt, NP  nitroGLYCERIN (NITROSTAT) 0.4 MG SL tablet Place 1 tablet (0.4 mg total) under the tongue every 5 (five) minutes as needed for chest pain. 10/10/16 02/13/19  Reymundo Poll, MD  ondansetron (ZOFRAN) 4 MG tablet Take 1 tablet (4 mg total) by mouth every 8 (eight) hours as needed for nausea or vomiting. Patient not taking: Reported on 09/11/2019 07/08/19  Merrilee Jansky, MD  sertraline (ZOLOFT) 100 MG tablet TAKE 1 TABLET BY MOUTH AT NIGHT 01/28/20   Rema Fendt, NP  tamsulosin (FLOMAX) 0.4 MG CAPS capsule Take 1 capsule (0.4 mg total) by mouth daily. 07/22/19   Tilden Fossa, MD  zinc gluconate 50 MG tablet Take 1 tablet (50 mg total) by mouth daily. 07/16/19   Storm Frisk, MD    Family History Family History  Problem Relation Age of Onset  . Diabetes Mother   . Renal Disease Mother   . Hypertension Mother   . Kidney disease Mother        on kidney transplant list   . Diabetes Maternal Aunt   . Kidney  disease Maternal Aunt   . Diabetes Maternal Grandmother   . Diabetes Maternal Grandfather   . Kidney disease Maternal Grandfather   . Diabetes Maternal Aunt   . Stroke Other        great grand mother  . Cancer Other        great uncle  . Migraines Father     Social History Social History   Tobacco Use  . Smoking status: Never Smoker  . Smokeless tobacco: Never Used  Vaping Use  . Vaping Use: Never used  Substance Use Topics  . Alcohol use: No    Alcohol/week: 0.0 standard drinks  . Drug use: No     Allergies   Patient has no known allergies.   Review of Systems Review of Systems Per HPI Physical Exam Triage Vital Signs ED Triage Vitals  Enc Vitals Group     BP 10/28/20 2008 131/88     Pulse Rate 10/28/20 2008 82     Resp 10/28/20 2008 18     Temp 10/28/20 2008 98.3 F (36.8 C)     Temp Source 10/28/20 2008 Oral     SpO2 10/28/20 2008 97 %     Weight --      Height --      Head Circumference --      Peak Flow --      Pain Score 10/28/20 2006 6     Pain Loc --      Pain Edu? --      Excl. in GC? --    No data found.  Updated Vital Signs BP 131/88 (BP Location: Right Arm)   Pulse 82   Temp 98.3 F (36.8 C) (Oral)   Resp 18   SpO2 97%   Visual Acuity Right Eye Distance:   Left Eye Distance:   Bilateral Distance:    Right Eye Near:   Left Eye Near:    Bilateral Near:     Physical Exam Vitals and nursing note reviewed.  Constitutional:      Appearance: Normal appearance.  HENT:     Head: Atraumatic.     Right Ear: Tympanic membrane normal.     Left Ear: Tympanic membrane normal.     Nose: Rhinorrhea present.     Comments: Bilateral nasal turbinates erythematous, edematous    Mouth/Throat:     Mouth: Mucous membranes are moist.     Pharynx: Posterior oropharyngeal erythema present.  Eyes:     Extraocular Movements: Extraocular movements intact.     Conjunctiva/sclera: Conjunctivae normal.  Cardiovascular:     Rate and Rhythm: Normal  rate and regular rhythm.  Pulmonary:     Effort: Pulmonary effort is normal.     Breath sounds: Normal breath sounds. No wheezing or rales.  Musculoskeletal:        General: Normal range of motion.     Cervical back: Normal range of motion and neck supple.  Skin:    General: Skin is warm and dry.  Neurological:     General: No focal deficit present.     Mental Status: He is oriented to person, place, and time.  Psychiatric:        Mood and Affect: Mood normal.        Thought Content: Thought content normal.        Judgment: Judgment normal.      UC Treatments / Results  Labs (all labs ordered are listed, but only abnormal results are displayed) Labs Reviewed - No data to display  EKG   Radiology No results found.  Procedures Procedures (including critical care time)  Medications Ordered in UC Medications - No data to display  Initial Impression / Assessment and Plan / UC Course  I have reviewed the triage vital signs and the nursing notes.  Pertinent labs & imaging results that were available during my care of the patient were reviewed by me and considered in my medical decision making (see chart for details).     Suspect uncontrolled seasonal allergies causing his persistent symptoms.  States he has Careers adviser and Flonase at home already but has not been taking them.  Discussed proper use of these and will also start Phenergan DM for his cough.  May continue Mucinex products, sinus rinses, etc for over-the-counter symptomatic relief.  Return for acutely worsening symptoms  Final Clinical Impressions(s) / UC Diagnoses   Final diagnoses:  Seasonal allergic rhinitis due to other allergic trigger     Discharge Instructions     Take your Allegra and Flonase every single day that you have at home.  Try the Flonase twice daily for better relief.    ED Prescriptions    Medication Sig Dispense Auth. Provider   promethazine-dextromethorphan (PROMETHAZINE-DM) 6.25-15  MG/5ML syrup Take 5 mLs by mouth 4 (four) times daily as needed for cough. 100 mL Particia Nearing, [place]     PDMP not reviewed this encounter.   Particia Nearing, [place] 10/29/20 2038

## 2023-04-24 ENCOUNTER — Encounter (HOSPITAL_BASED_OUTPATIENT_CLINIC_OR_DEPARTMENT_OTHER): Payer: Self-pay

## 2023-04-24 ENCOUNTER — Emergency Department (HOSPITAL_BASED_OUTPATIENT_CLINIC_OR_DEPARTMENT_OTHER)
Admission: EM | Admit: 2023-04-24 | Discharge: 2023-04-25 | Disposition: A | Discharge status: Home / Self Care | Payer: Non-veteran care | Attending: Emergency Medicine | Admitting: Emergency Medicine

## 2023-04-24 DIAGNOSIS — Z794 Long term (current) use of insulin: Secondary | ICD-10-CM | POA: Diagnosis not present

## 2023-04-24 DIAGNOSIS — R251 Tremor, unspecified: Secondary | ICD-10-CM | POA: Diagnosis not present

## 2023-04-24 DIAGNOSIS — F419 Anxiety disorder, unspecified: Secondary | ICD-10-CM | POA: Diagnosis not present

## 2023-04-24 DIAGNOSIS — R002 Palpitations: Secondary | ICD-10-CM | POA: Insufficient documentation

## 2023-04-24 DIAGNOSIS — R0602 Shortness of breath: Secondary | ICD-10-CM | POA: Insufficient documentation

## 2023-04-24 DIAGNOSIS — Z7984 Long term (current) use of oral hypoglycemic drugs: Secondary | ICD-10-CM | POA: Diagnosis not present

## 2023-04-24 DIAGNOSIS — R079 Chest pain, unspecified: Secondary | ICD-10-CM | POA: Diagnosis not present

## 2023-04-24 DIAGNOSIS — E119 Type 2 diabetes mellitus without complications: Secondary | ICD-10-CM | POA: Insufficient documentation

## 2023-04-24 LAB — BASIC METABOLIC PANEL
Anion gap: 8 (ref 5–15)
BUN: 9 mg/dL (ref 6–20)
CO2: 27 mmol/L (ref 22–32)
Calcium: 9.3 mg/dL (ref 8.9–10.3)
Chloride: 103 mmol/L (ref 98–111)
Creatinine, Ser: 0.99 mg/dL (ref 0.61–1.24)
GFR, Estimated: >60 mL/min (ref >60)
Glucose, Bld: 115 mg/dL — ABNORMAL HIGH (ref 70–99)
Potassium: 3.9 mmol/L (ref 3.5–5.1)
Sodium: 138 mmol/L (ref 135–145)

## 2023-04-24 LAB — CBC
HCT: 43.8 % (ref 39.0–52.0)
Hemoglobin: 15.5 g/dL (ref 13.0–17.0)
MCH: 32.2 pg (ref 26.0–34.0)
MCHC: 35.4 g/dL (ref 30.0–36.0)
MCV: 90.9 fL (ref 80.0–100.0)
Platelets: 180 10*3/uL (ref 150–400)
RBC: 4.82 MIL/uL (ref 4.22–5.81)
RDW: 12.2 % (ref 11.5–15.5)
WBC: 3.1 10*3/uL — ABNORMAL LOW (ref 4.0–10.5)
nRBC: 0 % (ref 0.0–0.2)

## 2023-04-24 LAB — TROPONIN I (HIGH SENSITIVITY): Troponin I (High Sensitivity): 3 ng/L (ref <18)

## 2023-04-24 NOTE — ED Triage Notes (Signed)
Pt states that told woke up today around 330 and thinks he may be having anxiety. Reports he has been trembling, having palpations with some CP, hx of anxiety

## 2023-04-25 LAB — TROPONIN I (HIGH SENSITIVITY): Troponin I (High Sensitivity): 3 ng/L (ref <18)

## 2023-04-25 MED ORDER — LORAZEPAM 1 MG PO TABS
1.0000 mg | ORAL_TABLET | Freq: Once | ORAL | Status: AC
  Administered 2023-04-25: 1 mg via ORAL
  Filled 2023-04-25: qty 1

## 2023-04-25 NOTE — ED Provider Notes (Signed)
[place] [facility]ERGENCY DEPARTMENT AT [facility] Provider Note   CSN: [id number] Arrival date & time: 04/24/23  1931     History  Chief Complaint  Patient presents with   Anxiety   Palpitations    Justin Drake is a 47 y.o. male.  47 year old male with history of diabetes on insulin who presents to the ER today secondary to palpitations.  Patient states that he woke up this morning to the bathroom.  When he laid back in the bed he noticed that he was having his heartbeat and irregular and seem to be fast.  He last about for 5 minutes.  He thinks that it came on slowly went away slowly.  Was associated with some hand trembling, chest pain and shortness of breath.  Patient states he was unable to go to sleep after that.  He did states he has had a lot of increased stress recently with some different family and social issues.  Is not always sleeping as well as he should.  Patient states that he is never anything like this before.  He feels fine at this time besides a little bit of tremulousness in his body but no tremors.   Anxiety  Palpitations      Home Medications Prior to Admission medications   Medication Sig Start Date End Date Taking? Authorizing Provider  albuterol (VENTOLIN HFA) 108 (90 Base) MCG/ACT inhaler Inhale 2 puffs into the lungs every 6 (six) hours as needed for wheezing or shortness of breath. 09/11/19   Fulp, Cammie, MD  atorvastatin (LIPITOR) 10 MG tablet 1 pill daily in the evening 01/28/20   Rema Fendt, NP  Cholecalciferol (VITAMIN D) 125 MCG (5000 UT) CAPS Take 1 capsule by mouth daily. 01/28/20   Rema Fendt, NP  insulin glargine (LANTUS SOLOSTAR) 100 UNIT/ML Solostar Pen INJECT 23 UNITS INTO THE SKIN DAILY. 04/21/20   Fulp, Cammie, MD  Insulin Pen Needle (TRUEPLUS PEN NEEDLES) 32G X 4 MM MISC Use as directed to inject insulin daily. 10/02/19   Fulp, Cammie, MD  metFORMIN (GLUCOPHAGE-XR) 500 MG 24 hr tablet 2 tablets (1,000 mg) once per day  after the evening meal 01/28/20   Rema Fendt, NP  nitroGLYCERIN (NITROSTAT) 0.4 MG SL tablet Place 1 tablet (0.4 mg total) under the tongue every 5 (five) minutes as needed for chest pain. 10/10/16 02/13/19  Reymundo Poll, MD  promethazine-dextromethorphan (PROMETHAZINE-DM) 6.25-15 MG/5ML syrup Take 5 mLs by mouth 4 (four) times daily as needed for cough. 10/28/20   Particia Nearing, PA-C  sertraline (ZOLOFT) 100 MG tablet TAKE 1 TABLET BY MOUTH AT NIGHT 01/28/20   Rema Fendt, NP  tamsulosin (FLOMAX) 0.4 MG CAPS capsule Take 1 capsule (0.4 mg total) by mouth daily. 07/22/19   Tilden Fossa, MD  zinc gluconate 50 MG tablet Take 1 tablet (50 mg total) by mouth daily. 07/16/19   Storm Frisk, MD      Allergies    Patient has no known allergies.    Review of Systems   Review of Systems  Cardiovascular:  Positive for palpitations.    Physical Exam Updated Vital Signs BP (!) 141/88   Pulse 60   Temp 98.3 F (36.8 C) (Oral)   Resp 16   Ht 5\' 9"  (1.753 m)   Wt 104.3 kg   SpO2 100%   BMI 33.97 kg/m  Physical Exam Vitals and nursing note reviewed.  Constitutional:      Appearance: He is well-developed.  HENT:     Head: Normocephalic and atraumatic.  Eyes:     Pupils: Pupils are equal, round, and reactive to light.  Cardiovascular:     Rate and Rhythm: Normal rate.  Pulmonary:     Effort: Pulmonary effort is normal. No respiratory distress.  Abdominal:     General: There is no distension.  Musculoskeletal:        General: Normal range of motion.     Cervical back: Normal range of motion.  Skin:    General: Skin is warm and dry.  Neurological:     General: No focal deficit present.     Mental Status: He is alert.     Comments: No altered mental status, able to give full seemingly accurate history.  Face is symmetric, EOM's intact, pupils equal and reactive, vision intact, tongue and uvula midline without deviation. Upper and Lower extremity motor 5/5,  intact pain perception in distal extremities, 2+ reflexes in biceps, patella and achilles tendons. Able to perform finger to nose normal with both hands. Walks without assistance or evident ataxia.       ED Results / Procedures / Treatments   Labs (all labs ordered are listed, but only abnormal results are displayed) Labs Reviewed  BASIC METABOLIC PANEL - Abnormal; Notable for the following components:      Result Value   Glucose, Bld 115 (*)    All other components within normal limits  CBC - Abnormal; Notable for the following components:   WBC 3.1 (*)    All other components within normal limits  TROPONIN I (HIGH SENSITIVITY)  TROPONIN I (HIGH SENSITIVITY)    EKG EKG Interpretation Date/Time:  Monday April 24 2023 20:08:17 EDT Ventricular Rate:  65 PR Interval:  158 QRS Duration:  88 QT Interval:  394 QTC Calculation: 409 R Axis:   19  Text Interpretation: Normal sinus rhythm Possible Anterior infarct , age undetermined Abnormal ECG When compared with ECG of 30-Jul-2019 12:16, Borderline criteria for Anterior infarct are now Present Confirmed by Marily Memos [phone]) on 04/24/2023 11:55:27 PM  Radiology No results found.  Procedures Procedures    Medications Ordered in ED Medications  LORazepam (ATIVAN) tablet 1 mg (1 mg Oral Given 04/25/23 0125)    ED Course/ Medical Decision Making/ A&P                                 Medical Decision Making Amount and/or Complexity of Data Reviewed Labs: ordered.  Risk Prescription drug management.   Very well could be anxiety however with little bit elevated blood pressure diabetes and age also consider possible A-fib will need cardiology follow-up for cardiac monitoring.  Electrolytes okay.  Had a couple PVCs while was talking to them on his monitor but no other significant arrhythmias noted in the hour or so there is on the monitor here.  Low suspicion for PE without any hypoxia, tachycardia, tachypnea or s dyspnea.   X-ray viewed interpreted by myself as no obvious pneumonia or pneumothorax.  Suspicion for any other emergent etiology of symptoms at this time.  Will discharge to follow-up with either cardiology at the [facility] but if not able to be seen in a reasonable time cardiology referral was placed.  Final Clinical Impression(s) / ED Diagnoses Final diagnoses:  Palpitations    Rx / DC Orders ED Discharge Orders          Ordered  Ambulatory referral to Cardiology       Comments: Palpitations and chest pain   04/25/23 0118              Johni Narine, Barbara Cower, MD 04/25/23 [phone]

## 2024-03-14 ENCOUNTER — Other Ambulatory Visit: Payer: Self-pay

## 2024-03-14 ENCOUNTER — Emergency Department (HOSPITAL_BASED_OUTPATIENT_CLINIC_OR_DEPARTMENT_OTHER)
Admission: EM | Admit: 2024-03-14 | Discharge: 2024-03-14 | Disposition: A | Discharge status: Home / Self Care | Attending: Emergency Medicine | Admitting: Emergency Medicine

## 2024-03-14 ENCOUNTER — Encounter (HOSPITAL_BASED_OUTPATIENT_CLINIC_OR_DEPARTMENT_OTHER): Payer: Self-pay

## 2024-03-14 DIAGNOSIS — E119 Type 2 diabetes mellitus without complications: Secondary | ICD-10-CM | POA: Diagnosis not present

## 2024-03-14 DIAGNOSIS — Z794 Long term (current) use of insulin: Secondary | ICD-10-CM | POA: Insufficient documentation

## 2024-03-14 DIAGNOSIS — G44209 Tension-type headache, unspecified, not intractable: Secondary | ICD-10-CM | POA: Diagnosis not present

## 2024-03-14 DIAGNOSIS — Z7984 Long term (current) use of oral hypoglycemic drugs: Secondary | ICD-10-CM | POA: Insufficient documentation

## 2024-03-14 DIAGNOSIS — E861 Hypovolemia: Secondary | ICD-10-CM

## 2024-03-14 DIAGNOSIS — E869 Volume depletion, unspecified: Secondary | ICD-10-CM | POA: Insufficient documentation

## 2024-03-14 DIAGNOSIS — R519 Headache, unspecified: Secondary | ICD-10-CM | POA: Diagnosis present

## 2024-03-14 LAB — URINALYSIS, ROUTINE W REFLEX MICROSCOPIC
Bacteria, UA: NONE SEEN
Bilirubin Urine: NEGATIVE
Glucose, UA: >1000 mg/dL — AB
Hgb urine dipstick: NEGATIVE
Ketones, ur: NEGATIVE mg/dL
Leukocytes,Ua: NEGATIVE
Nitrite: NEGATIVE
Protein, ur: NEGATIVE mg/dL
Specific Gravity, Urine: 1.031 — ABNORMAL HIGH (ref 1.005–1.030)
pH: 6 (ref 5.0–8.0)

## 2024-03-14 LAB — COMPREHENSIVE METABOLIC PANEL WITH GFR
ALT: 13 U/L (ref 0–44)
AST: 20 U/L (ref 15–41)
Albumin: 4.3 g/dL (ref 3.5–5.0)
Alkaline Phosphatase: 91 U/L (ref 38–126)
Anion gap: 12 (ref 5–15)
BUN: 12 mg/dL (ref 6–20)
CO2: 24 mmol/L (ref 22–32)
Calcium: 9.6 mg/dL (ref 8.9–10.3)
Chloride: 104 mmol/L (ref 98–111)
Creatinine, Ser: 1.43 mg/dL — ABNORMAL HIGH (ref 0.61–1.24)
GFR, Estimated: >60 mL/min (ref >60)
Glucose, Bld: 107 mg/dL — ABNORMAL HIGH (ref 70–99)
Potassium: 4.2 mmol/L (ref 3.5–5.1)
Sodium: 140 mmol/L (ref 135–145)
Total Bilirubin: 0.5 mg/dL (ref 0.0–1.2)
Total Protein: 7.1 g/dL (ref 6.5–8.1)

## 2024-03-14 LAB — CBC WITH DIFFERENTIAL/PLATELET
Abs Immature Granulocytes: 0.01 K/uL (ref 0.00–0.07)
Basophils Absolute: 0.1 K/uL (ref 0.0–0.1)
Basophils Relative: 2 %
Eosinophils Absolute: 0.1 K/uL (ref 0.0–0.5)
Eosinophils Relative: 4 %
HCT: 43.7 % (ref 39.0–52.0)
Hemoglobin: 15.4 g/dL (ref 13.0–17.0)
Immature Granulocytes: 0 %
Lymphocytes Relative: 54 %
Lymphs Abs: 1.8 K/uL (ref 0.7–4.0)
MCH: 32.8 pg (ref 26.0–34.0)
MCHC: 35.2 g/dL (ref 30.0–36.0)
MCV: 93.2 fL (ref 80.0–100.0)
Monocytes Absolute: 0.4 K/uL (ref 0.1–1.0)
Monocytes Relative: 12 %
Neutro Abs: 0.9 K/uL — ABNORMAL LOW (ref 1.7–7.7)
Neutrophils Relative %: 28 %
Platelets: 148 K/uL — ABNORMAL LOW (ref 150–400)
RBC: 4.69 MIL/uL (ref 4.22–5.81)
RDW: 12.3 % (ref 11.5–15.5)
WBC: 3.2 K/uL — ABNORMAL LOW (ref 4.0–10.5)
nRBC: 0 % (ref 0.0–0.2)

## 2024-03-14 MED ORDER — IBUPROFEN 400 MG PO TABS
600.0000 mg | ORAL_TABLET | Freq: Once | ORAL | Status: AC
  Administered 2024-03-14: 600 mg via ORAL
  Filled 2024-03-14: qty 1

## 2024-03-14 MED ORDER — SODIUM CHLORIDE 0.9 % IV BOLUS
1000.0000 mL | Freq: Once | INTRAVENOUS | Status: AC
  Administered 2024-03-14: 1000 mL via INTRAVENOUS

## 2024-03-14 NOTE — Discharge Instructions (Addendum)
 Be sure to adequately hydrate, drinking between 6-8 normal 16 ounce bottles of water per day.  Please follow-up with your primary care regarding the increased glucose in your urine as well as to follow-up on the hemoglobin A1c obtained today. I would also recommend following up with primary care to have your creatinine rechecked in 2 to 3 days.  Return to ER with new or worsening symptoms.

## 2024-03-14 NOTE — ED Provider Notes (Signed)
 [place] EMERGENCY DEPARTMENT AT [facility] Provider Note   CSN: [id number] Arrival date & time: 03/14/24  1935     Patient presents with: Headache and Palpitations   Justin Drake is a 48 y.o. male who presents to the ED today with concerns that he is dehydrated as he has an occipital headache that he states is present whenever he has had fluid volume deficit in the past.  His reason for presenting today was that he had a right-sided headache previously this is migrated to the left occiput.  He works as a Interior and spatial designer and has not been outside for prolonged amounts of time, denies having any dizziness or lightheadedness, denies any nausea or vomiting, states that he has normal p.o. intake and tolerates both solids and liquids without difficulty.  He does have occasional postural dizziness, but has not had any falls or near syncope as a result.  He denies have any nasal congestion, sore throat, or any other URI symptoms.  Review of his previous medical history does show diagnoses of type 2 diabetes, vitamin D deficiency, and hyperlipidemia.    Headache Palpitations      Prior to Admission medications   Medication Sig Start Date End Date Taking? Authorizing Provider  albuterol (VENTOLIN HFA) 108 (90 Base) MCG/ACT inhaler Inhale 2 puffs into the lungs every 6 (six) hours as needed for wheezing or shortness of breath. 09/11/19   Fulp, Cammie, MD  atorvastatin (LIPITOR) 10 MG tablet 1 pill daily in the evening 01/28/20   Lorren Greig PARAS, NP  Cholecalciferol (VITAMIN D) 125 MCG (5000 UT) CAPS Take 1 capsule by mouth daily. 01/28/20   Lorren Greig PARAS, NP  insulin glargine (LANTUS SOLOSTAR) 100 UNIT/ML Solostar Pen INJECT 23 UNITS INTO THE SKIN DAILY. 04/21/20   Fulp, Cammie, MD  Insulin Pen Needle (TRUEPLUS PEN NEEDLES) 32G X 4 MM MISC Use as directed to inject insulin daily. 10/02/19   Fulp, Cammie, MD  metFORMIN (GLUCOPHAGE-XR) 500 MG 24 hr tablet 2 tablets (1,000 mg) once per  day after the evening meal 01/28/20   Lorren Greig PARAS, NP  nitroGLYCERIN (NITROSTAT) 0.4 MG SL tablet Place 1 tablet (0.4 mg total) under the tongue every 5 (five) minutes as needed for chest pain. 10/10/16 02/13/19  Forest Coy, MD  promethazine-dextromethorphan (PROMETHAZINE-DM) 6.25-15 MG/5ML syrup Take 5 mLs by mouth 4 (four) times daily as needed for cough. 10/28/20   Stuart Vernell Norris, PA-C  sertraline (ZOLOFT) 100 MG tablet TAKE 1 TABLET BY MOUTH AT NIGHT 01/28/20   Lorren Greig PARAS, NP  tamsulosin (FLOMAX) 0.4 MG CAPS capsule Take 1 capsule (0.4 mg total) by mouth daily. 07/22/19   Griselda Norris, MD  zinc gluconate 50 MG tablet Take 1 tablet (50 mg total) by mouth daily. 07/16/19   Brien Belvie BRAVO, MD    Allergies: Patient has no known allergies.    Review of Systems  Cardiovascular:  Positive for palpitations.  Neurological:  Positive for headaches.  All other systems reviewed and are negative.   Updated Vital Signs BP 115/88   Pulse 84   Temp 98 F (36.7 C)   Resp 20   Ht 5' 9 (1.753 m)   Wt 106.6 kg   SpO2 93%   BMI 34.70 kg/m   Physical Exam Vitals and nursing note reviewed.  Constitutional:      General: He is not in acute distress.    Appearance: Normal appearance.  HENT:     Head: Normocephalic and atraumatic.  Mouth/Throat:     Mouth: Mucous membranes are moist.     Pharynx: Oropharynx is clear.     Comments: Pink moist oral mucosa is noted. Eyes:     General: No visual field deficit.    Extraocular Movements: Extraocular movements intact.     Conjunctiva/sclera: Conjunctivae normal.     Pupils: Pupils are equal, round, and reactive to light.  Cardiovascular:     Rate and Rhythm: Normal rate and regular rhythm.     Pulses: Normal pulses.     Heart sounds: Normal heart sounds. No murmur heard.    No friction rub. No gallop.  Pulmonary:     Effort: Pulmonary effort is normal.     Breath sounds: Normal breath sounds.  Abdominal:      General: Abdomen is flat. Bowel sounds are normal.     Palpations: Abdomen is soft.  Musculoskeletal:        General: Normal range of motion.     Cervical back: Normal range of motion and neck supple.     Right lower leg: No edema.     Left lower leg: No edema.  Skin:    General: Skin is warm and dry.     Capillary Refill: Capillary refill takes less than 2 seconds.     Comments: Skin turgor is normal  Neurological:     General: No focal deficit present.     Mental Status: He is alert and oriented to person, place, and time. Mental status is at baseline.     GCS: GCS eye subscore is 4. GCS verbal subscore is 5. GCS motor subscore is 6.     Cranial Nerves: No cranial nerve deficit, dysarthria or facial asymmetry.     Sensory: No sensory deficit.     Motor: No weakness.     Coordination: Coordination is intact.     Gait: Gait is intact.  Psychiatric:        Mood and Affect: Mood normal.     (all labs ordered are listed, but only abnormal results are displayed) Labs Reviewed  COMPREHENSIVE METABOLIC PANEL WITH GFR - Abnormal; Notable for the following components:      Result Value   Glucose, Bld 107 (*)    Creatinine, Ser 1.43 (*)    All other components within normal limits  CBC WITH DIFFERENTIAL/PLATELET - Abnormal; Notable for the following components:   WBC 3.2 (*)    Platelets 148 (*)    All other components within normal limits  URINALYSIS, ROUTINE W REFLEX MICROSCOPIC - Abnormal; Notable for the following components:   Specific Gravity, Urine 1.031 (*)    Glucose, UA >1,000 (*)    All other components within normal limits  HEMOGLOBIN A1C    EKG: None  Radiology: No results found.   Procedures   Medications Ordered in the ED  ibuprofen (ADVIL) tablet 600 mg (has no administration in time range)  sodium chloride 0.9 % bolus 1,000 mL (1,000 mLs Intravenous New Bag/Given 03/14/24 2125)                                    Medical Decision Making Amount and/or  Complexity of Data Reviewed Labs: ordered.   Medical Decision Making:   Justin Drake is a 48 y.o. male who presented to the ED today with headache detailed above.     Complete initial physical exam performed, notably the patient  was alert and oriented in no apparent distress with no notable exam findings..    Reviewed and confirmed nursing documentation for past medical history, family history, social history.    Initial Assessment:   With the patient's presentation of headache, differential diagnosis includes tension headache, sinus headache, migraine, fluid volume deficit.  Further consider acute upper respiratory infection and sinusitis.  Initial Plan:  Based on physical exam findings, diagnosis of migraine less than likely as this is not a unilateral headache, and he has no visual symptoms and no dizziness or vertigo.  Therefore we will defer any imaging at this time. Screening labs including CBC and Metabolic panel to evaluate for infectious or metabolic etiology of disease.  Urinalysis with reflex culture ordered to evaluate for UTI or relevant urologic/nephrologic pathology.  EKG to evaluate for cardiac pathology Objective evaluation as below reviewed   Initial Study Results:   Laboratory  All laboratory results reviewed without evidence of clinically relevant pathology.   Exceptions include: Elevated creatinine to 1.43  EKG EKG was reviewed independently. Rate, rhythm, axis, intervals all examined and without medically relevant abnormality. ST segments without concerns for elevations.    Reassessment and Plan:   This patient does demonstrate an elevated creatinine of 1.43, began IV infusion of 1 L normal saline bolus.  Plan to reevaluate and plan at this time is if headache has resolved after fluid bolus and patient is subjectively improved to discharge with follow-up to primary care within the next 2 weeks.  Secondary to his profound glycosuria, assess hemoglobin A1c  to determine level of glycemic control.  Plan at this time is for discharge after reassessment of the patient, he has received 1 L saline IV bolus as well as 600 mg of ibuprofen to manage his headache.  As his physical exam and neurological exam are read assuring, believe he would be stable for discharge with outpatient follow-up to his primary care which he states is due within the next week.  This has already been scheduled as noted by the patient.  Further discussed his glycosuria and the obtaining of hemoglobin A1c, discussed with him to follow-up on the hemoglobin A1c results with his primary care.  Care handed off to J.Barrett, PA-C.       Final diagnoses:  Acute non intractable tension-type headache  Fluid volume deficit    ED Discharge Orders     None          Myriam Dorn BROCKS, [place] 03/14/24 2205    Patsey Lot, MD 03/15/24 [phone]

## 2024-03-14 NOTE — ED Triage Notes (Signed)
 Pt POV concerned for dehydration, reporting headache and palpitations past few days, endorses normal diet. Diabetic, dexcom, sugars WNL per pt.

## 2024-03-14 NOTE — ED Provider Notes (Signed)
  Physical Exam  BP (!) 126/90   Pulse 74   Temp 98 F (36.7 C)   Resp 16   Ht 5' 9 (1.753 m)   Wt 106.6 kg   SpO2 99%   BMI 34.70 kg/m   Physical Exam Cardiovascular:     Rate and Rhythm: Normal rate and regular rhythm.  Pulmonary:     Effort: Pulmonary effort is normal.     Breath sounds: Normal breath sounds.  Abdominal:     General: Bowel sounds are normal.     Palpations: Abdomen is soft.  Neurological:     GCS: GCS eye subscore is 4. GCS verbal subscore is 5. GCS motor subscore is 6.     Procedures  Procedures  ED Course / MDM    Medical Decision Making Amount and/or Complexity of Data Reviewed Labs: ordered.   Patient was received in signout from prior ED provider.  In short patient presents with concern of occipital headache.  He has no falls.  He has a nonfocal neurological exam.  Plan is to check basic labs, with no need for imaging.  Patient is currently receiving 1 L normal saline and has been given ibuprofen.  Plan is to discharge after reassessment.  On reassessment patient appears well.  He is hemodynamically stable.  I encouraged him to have recheck of creatinine with primary care in 2 to 3 days.  He is feeling better and tolerating oral intake.  Feel appropriate for discharge with outpatient follow-up.       Estelle Skibicki N, PA-C 03/14/24 2314    Patsey Lot, MD 03/15/24 [phone]

## 2024-03-15 LAB — HEMOGLOBIN A1C
Hgb A1c MFr Bld: 5.7 % — ABNORMAL HIGH (ref 4.8–5.6)
Mean Plasma Glucose: 116.89 mg/dL
